# Patient Record
Sex: Male | Born: 1985 | Race: White | Hispanic: No | Marital: Married | Smoking: Never smoker
Health system: Southern US, Community
[De-identification: ages and names within clinical notes are randomized; demographics above are authoritative.]

## PROBLEM LIST (undated history)

## (undated) HISTORY — PX: NM RENAL LASIX (ARMC HX): HXRAD1213

---

## 2019-11-29 DIAGNOSIS — F4320 Adjustment disorder, unspecified: Secondary | ICD-10-CM | POA: Diagnosis not present

## 2019-12-13 DIAGNOSIS — F4320 Adjustment disorder, unspecified: Secondary | ICD-10-CM | POA: Diagnosis not present

## 2019-12-27 DIAGNOSIS — F4320 Adjustment disorder, unspecified: Secondary | ICD-10-CM | POA: Diagnosis not present

## 2020-01-10 DIAGNOSIS — F4320 Adjustment disorder, unspecified: Secondary | ICD-10-CM | POA: Diagnosis not present

## 2020-02-07 DIAGNOSIS — F4320 Adjustment disorder, unspecified: Secondary | ICD-10-CM | POA: Diagnosis not present

## 2020-07-19 DIAGNOSIS — F330 Major depressive disorder, recurrent, mild: Secondary | ICD-10-CM | POA: Diagnosis not present

## 2020-07-29 DIAGNOSIS — F330 Major depressive disorder, recurrent, mild: Secondary | ICD-10-CM | POA: Diagnosis not present

## 2020-08-05 DIAGNOSIS — F330 Major depressive disorder, recurrent, mild: Secondary | ICD-10-CM | POA: Diagnosis not present

## 2020-08-12 DIAGNOSIS — F330 Major depressive disorder, recurrent, mild: Secondary | ICD-10-CM | POA: Diagnosis not present

## 2020-08-21 ENCOUNTER — Ambulatory Visit
Admission: EM | Admit: 2020-08-21 | Discharge: 2020-08-21 | Disposition: A | Discharge status: Home / Self Care | Payer: BC Managed Care – PPO | Attending: Emergency Medicine | Admitting: Emergency Medicine

## 2020-08-21 ENCOUNTER — Other Ambulatory Visit: Payer: Self-pay

## 2020-08-21 DIAGNOSIS — J029 Acute pharyngitis, unspecified: Secondary | ICD-10-CM

## 2020-08-21 DIAGNOSIS — J069 Acute upper respiratory infection, unspecified: Secondary | ICD-10-CM | POA: Diagnosis not present

## 2020-08-21 LAB — POCT RAPID STREP A (OFFICE): Rapid Strep A Screen: NEGATIVE

## 2020-08-21 NOTE — Discharge Instructions (Addendum)
Your rapid strep test is negative.  A throat culture is pending; we will call you if it is positive requiring treatment.    Your COVID test is pending.  You should self quarantine until the test result is back.    Take Tylenol or ibuprofen as needed for fever or discomfort.  Rest and keep yourself hydrated.    Follow-up with your primary care provider if your symptoms are not improving.

## 2020-08-21 NOTE — ED Triage Notes (Cosign Needed Addendum)
Patient presents to Urgent Care with complaints of sore throat since yesterday/  Patient reports battling cold-like symptoms for the past week. He reports hx of strep throat. Denies use of medications for symptoms.

## 2020-08-21 NOTE — ED Provider Notes (Signed)
Roderic Palau    CSN: [id number] Arrival date & time: 08/21/20  1139      History   Chief Complaint Chief Complaint  Patient presents with  . Sore Throat    HPI Brett Hickman is a 34 y.o. male.   Patient presents with 1 day history of sore throat.  He reports intermittent cold symptoms for 1 week, including mild nonproductive cough and congestion.  He denies fever, rash, shortness of breath, vomiting, diarrhea, or other symptoms.  No treatments attempted at home.  He denies pertinent medical history.  The history is provided by the patient.    History reviewed. No pertinent past medical history.  There are no problems to display for this patient.   History reviewed. No pertinent surgical history.     Home Medications    Prior to Admission medications   Not on File    Family History Family History  Problem Relation Age of Onset  . Healthy Mother   . Healthy Father     Social History Social History   Tobacco Use  . Smoking status: Never Smoker  . Smokeless tobacco: Never Used  Vaping Use  . Vaping Use: Never used  Substance Use Topics  . Alcohol use: Yes    Comment: social drinker  . Drug use: Never     Allergies   Patient has no allergy information on record.   Review of Systems Review of Systems  Constitutional: Negative for chills and fever.  HENT: Positive for congestion and sore throat. Negative for ear pain.   Eyes: Negative for pain and visual disturbance.  Respiratory: Positive for cough. Negative for shortness of breath.   Cardiovascular: Negative for chest pain and palpitations.  Gastrointestinal: Negative for abdominal pain and vomiting.  Genitourinary: Negative for dysuria and hematuria.  Musculoskeletal: Negative for arthralgias and back pain.  Skin: Negative for color change and rash.  Neurological: Negative for seizures and syncope.  All other systems reviewed and are negative.    Physical Exam Triage Vital  Signs ED Triage Vitals  Enc Vitals Group     BP      Pulse      Resp      Temp      Temp src      SpO2      Weight      Height      Head Circumference      Peak Flow      Pain Score      Pain Loc      Pain Edu?      Excl. in [place]?    No data found.  Updated Vital Signs BP 114/74 (BP Location: Left Arm)   Pulse 82   Temp 98.4 F (36.9 C) (Temporal)   Resp 16   SpO2 98%   Visual Acuity Right Eye Distance:   Left Eye Distance:   Bilateral Distance:    Right Eye Near:   Left Eye Near:    Bilateral Near:     Physical Exam Vitals and nursing note reviewed.  Constitutional:      General: He is not in acute distress.    Appearance: He is well-developed.  HENT:     Head: Normocephalic and atraumatic.     Right Ear: Tympanic membrane normal.     Left Ear: Tympanic membrane normal.     Nose: Nose normal.     Mouth/Throat:     Mouth: Mucous membranes are moist.  Pharynx: Posterior oropharyngeal erythema present.  Eyes:     Conjunctiva/sclera: Conjunctivae normal.  Cardiovascular:     Rate and Rhythm: Normal rate and regular rhythm.     Heart sounds: Normal heart sounds.  Pulmonary:     Effort: Pulmonary effort is normal. No respiratory distress.     Breath sounds: Normal breath sounds. No wheezing or rhonchi.  Abdominal:     Palpations: Abdomen is soft.     Tenderness: There is no abdominal tenderness. There is no guarding or rebound.  Musculoskeletal:     Cervical back: Neck supple.  Skin:    General: Skin is warm and dry.     Findings: No rash.  Neurological:     General: No focal deficit present.     Mental Status: He is alert and oriented to person, place, and time.     Gait: Gait normal.  Psychiatric:        Mood and Affect: Mood normal.        Behavior: Behavior normal.      UC Treatments / Results  Labs (all labs ordered are listed, but only abnormal results are displayed) Labs Reviewed  CULTURE, GROUP A STREP ([facility])  NOVEL CORONAVIRUS,  NAA  POCT RAPID STREP A (OFFICE)    EKG   Radiology No results found.  Procedures Procedures (including critical care time)  Medications Ordered in UC Medications - No data to display  Initial Impression / Assessment and Plan / UC Course  I have reviewed the triage vital signs and the nursing notes.  Pertinent labs & imaging results that were available during my care of the patient were reviewed by me and considered in my medical decision making (see chart for details).   Throat, viral URI.  Rapid strep negative; culture pending.  PCR COVID pending.  Instructed patient to self quarantine.  Discussed symptomatic treatment.  Instructed him to follow-up with his PCP if his symptoms are not improving.  Patient agrees to plan of care.   Final Clinical Impressions(s) / UC Diagnoses   Final diagnoses:  Sore throat  Viral URI     Discharge Instructions     Your rapid strep test is negative.  A throat culture is pending; we will call you if it is positive requiring treatment.    Your COVID test is pending.  You should self quarantine until the test result is back.    Take Tylenol or ibuprofen as needed for fever or discomfort.  Rest and keep yourself hydrated.    Follow-up with your primary care provider if your symptoms are not improving.        ED Prescriptions    None     PDMP not reviewed this encounter.   Sharion Balloon, NP 08/21/20 1245

## 2020-08-22 LAB — SARS-COV-2, NAA 2 DAY TAT

## 2020-08-22 LAB — NOVEL CORONAVIRUS, NAA: SARS-CoV-2, NAA: NOT DETECTED

## 2020-08-24 LAB — CULTURE, GROUP A STREP (THRC)

## 2020-08-28 ENCOUNTER — Encounter: Payer: Self-pay | Admitting: Emergency Medicine

## 2020-08-28 ENCOUNTER — Ambulatory Visit
Admission: EM | Admit: 2020-08-28 | Discharge: 2020-08-28 | Disposition: A | Discharge status: Home / Self Care | Payer: BC Managed Care – PPO | Attending: Emergency Medicine | Admitting: Emergency Medicine

## 2020-08-28 ENCOUNTER — Other Ambulatory Visit: Payer: Self-pay

## 2020-08-28 DIAGNOSIS — J0100 Acute maxillary sinusitis, unspecified: Secondary | ICD-10-CM | POA: Diagnosis not present

## 2020-08-28 MED ORDER — AMOXICILLIN 875 MG PO TABS: 875.0000 mg | ORAL_TABLET | Freq: Two times a day (BID) | ORAL | 0 refills | Status: AC

## 2020-08-28 NOTE — Discharge Instructions (Addendum)
Take the amoxicillin as directed.  Follow up with your primary care provider if your symptoms are not improving.   ° ° °

## 2020-08-28 NOTE — ED Provider Notes (Signed)
Brett Hickman    CSN: [id number] Arrival date & time: 08/28/20  5784      History   Chief Complaint Chief Complaint  Patient presents with  . Headache  . Cough    HPI Brett Hickman is a 34 y.o. male.   Patient presents with 2-week history of of cough productive of green sputum, headache, nasal congestion, ears ringing, dizziness.  Treatment attempted at home with Sudafed.  He denies fever, rash, shortness of breath, vomiting, diarrhea, or other symptoms.  Patient was seen here on 08/21/2020; diagnosed with sore throat and viral URI; treated symptomatically; throat culture negative, COVID negative.  The history is provided by the patient and medical records.    History reviewed. No pertinent past medical history.  There are no problems to display for this patient.   History reviewed. No pertinent surgical history.     Home Medications    Prior to Admission medications   Medication Sig Start Date End Date Taking? Authorizing Provider  amoxicillin (AMOXIL) 875 MG tablet Take 1 tablet (875 mg total) by mouth 2 (two) times daily for 7 days. 08/28/20 09/04/20  Sharion Balloon, NP    Family History Family History  Problem Relation Age of Onset  . Healthy Mother   . Healthy Father     Social History Social History   Tobacco Use  . Smoking status: Never Smoker  . Smokeless tobacco: Never Used  Vaping Use  . Vaping Use: Never used  Substance Use Topics  . Alcohol use: Yes    Comment: social drinker  . Drug use: Never     Allergies   Patient has no known allergies.   Review of Systems Review of Systems  Constitutional: Negative for chills and fever.  HENT: Positive for congestion, ear pain and postnasal drip. Negative for sore throat.   Eyes: Negative for pain and visual disturbance.  Respiratory: Positive for cough. Negative for shortness of breath.   Cardiovascular: Negative for chest pain and palpitations.  Gastrointestinal: Negative for  abdominal pain, diarrhea and vomiting.  Genitourinary: Negative for dysuria and hematuria.  Musculoskeletal: Negative for arthralgias and back pain.  Skin: Negative for color change and rash.  Neurological: Positive for dizziness. Negative for seizures and syncope.  All other systems reviewed and are negative.    Physical Exam Triage Vital Signs ED Triage Vitals  Enc Vitals Group     BP      Pulse      Resp      Temp      Temp src      SpO2      Weight      Height      Head Circumference      Peak Flow      Pain Score      Pain Loc      Pain Edu?      Excl. in [place]?    No data found.  Updated Vital Signs BP 106/63 (BP Location: Right Arm)   Pulse 97   Temp 97.9 F (36.6 C) (Oral)   Resp 15   Ht 6\' 3"  (1.905 m)   Wt 175 lb (79.4 kg)   SpO2 95%   BMI 21.87 kg/m   Visual Acuity Right Eye Distance:   Left Eye Distance:   Bilateral Distance:    Right Eye Near:   Left Eye Near:    Bilateral Near:     Physical Exam Vitals and nursing note reviewed.  Constitutional:      General: He is not in acute distress.    Appearance: He is well-developed.  HENT:     Head: Normocephalic and atraumatic.     Right Ear: Tympanic membrane normal.     Left Ear: Tympanic membrane normal.     Nose: Congestion present.     Mouth/Throat:     Mouth: Mucous membranes are moist.     Pharynx: Oropharynx is clear.  Eyes:     Conjunctiva/sclera: Conjunctivae normal.  Cardiovascular:     Rate and Rhythm: Normal rate and regular rhythm.     Heart sounds: Normal heart sounds.  Pulmonary:     Effort: Pulmonary effort is normal. No respiratory distress.     Breath sounds: Normal breath sounds.  Abdominal:     Palpations: Abdomen is soft.     Tenderness: There is no abdominal tenderness.  Musculoskeletal:     Cervical back: Neck supple.  Skin:    General: Skin is warm and dry.     Findings: No rash.  Neurological:     General: No focal deficit present.     Mental Status: He is  alert and oriented to person, place, and time.     Gait: Gait normal.  Psychiatric:        Mood and Affect: Mood normal.        Behavior: Behavior normal.      UC Treatments / Results  Labs (all labs ordered are listed, but only abnormal results are displayed) Labs Reviewed - No data to display  EKG   Radiology No results found.  Procedures Procedures (including critical care time)  Medications Ordered in UC Medications - No data to display  Initial Impression / Assessment and Plan / UC Course  I have reviewed the triage vital signs and the nursing notes.  Pertinent labs & imaging results that were available during my care of the patient were reviewed by me and considered in my medical decision making (see chart for details).   Acute sinusitis.  Treating with amoxicillin.  Discussed other symptomatic treatment including Mucinex.  Instructed patient to follow-up with his PCP if his symptoms are not improving.  Patient agrees to plan of care.   Final Clinical Impressions(s) / UC Diagnoses   Final diagnoses:  Acute non-recurrent maxillary sinusitis     Discharge Instructions     Take the amoxicillin as directed.    Follow up with your primary care provider if your symptoms are not improving.       ED Prescriptions    Medication Sig Dispense Auth. Provider   amoxicillin (AMOXIL) 875 MG tablet Take 1 tablet (875 mg total) by mouth 2 (two) times daily for 7 days. 14 tablet Sharion Balloon, NP     PDMP not reviewed this encounter.   Sharion Balloon, NP 08/28/20 [phone]

## 2020-08-28 NOTE — ED Triage Notes (Signed)
Patient c/o productive cough , headache, nasal congestion, dizziness, and tinnitus x 2 weeks.   Patient stated he presented at this clinic a week ago and stated he received COVID-19 testing and strep testing.   Patient stated he has taken Sudafed with relief of congestion.   Patient endorses "grenish" sputum production.   Patient denies fever.

## 2020-09-04 DIAGNOSIS — U071 COVID-19: Secondary | ICD-10-CM

## 2020-09-04 HISTORY — DX: COVID-19: U07.1

## 2020-10-21 DIAGNOSIS — F330 Major depressive disorder, recurrent, mild: Secondary | ICD-10-CM | POA: Diagnosis not present

## 2020-11-04 DIAGNOSIS — F330 Major depressive disorder, recurrent, mild: Secondary | ICD-10-CM | POA: Diagnosis not present

## 2020-11-11 DIAGNOSIS — F330 Major depressive disorder, recurrent, mild: Secondary | ICD-10-CM | POA: Diagnosis not present

## 2020-11-18 DIAGNOSIS — F330 Major depressive disorder, recurrent, mild: Secondary | ICD-10-CM | POA: Diagnosis not present

## 2020-12-09 DIAGNOSIS — F330 Major depressive disorder, recurrent, mild: Secondary | ICD-10-CM | POA: Diagnosis not present

## 2020-12-16 DIAGNOSIS — F330 Major depressive disorder, recurrent, mild: Secondary | ICD-10-CM | POA: Diagnosis not present

## 2020-12-30 DIAGNOSIS — F330 Major depressive disorder, recurrent, mild: Secondary | ICD-10-CM | POA: Diagnosis not present

## 2021-01-06 DIAGNOSIS — F330 Major depressive disorder, recurrent, mild: Secondary | ICD-10-CM | POA: Diagnosis not present

## 2021-01-13 DIAGNOSIS — F330 Major depressive disorder, recurrent, mild: Secondary | ICD-10-CM | POA: Diagnosis not present

## 2021-01-14 DIAGNOSIS — H40022 Open angle with borderline findings, high risk, left eye: Secondary | ICD-10-CM | POA: Diagnosis not present

## 2021-01-20 DIAGNOSIS — F330 Major depressive disorder, recurrent, mild: Secondary | ICD-10-CM | POA: Diagnosis not present

## 2021-01-27 DIAGNOSIS — F330 Major depressive disorder, recurrent, mild: Secondary | ICD-10-CM | POA: Diagnosis not present

## 2021-02-03 DIAGNOSIS — F330 Major depressive disorder, recurrent, mild: Secondary | ICD-10-CM | POA: Diagnosis not present

## 2021-02-17 DIAGNOSIS — F330 Major depressive disorder, recurrent, mild: Secondary | ICD-10-CM | POA: Diagnosis not present

## 2021-02-24 DIAGNOSIS — F330 Major depressive disorder, recurrent, mild: Secondary | ICD-10-CM | POA: Diagnosis not present

## 2021-02-28 DIAGNOSIS — H40013 Open angle with borderline findings, low risk, bilateral: Secondary | ICD-10-CM | POA: Diagnosis not present

## 2021-03-10 DIAGNOSIS — F330 Major depressive disorder, recurrent, mild: Secondary | ICD-10-CM | POA: Diagnosis not present

## 2021-03-17 DIAGNOSIS — F330 Major depressive disorder, recurrent, mild: Secondary | ICD-10-CM | POA: Diagnosis not present

## 2021-03-25 ENCOUNTER — Encounter: Payer: Self-pay | Admitting: Emergency Medicine

## 2021-03-25 ENCOUNTER — Other Ambulatory Visit: Payer: Self-pay

## 2021-03-25 ENCOUNTER — Ambulatory Visit
Admission: EM | Admit: 2021-03-25 | Discharge: 2021-03-25 | Disposition: A | Discharge status: Home / Self Care | Payer: BC Managed Care – PPO

## 2021-03-25 DIAGNOSIS — J302 Other seasonal allergic rhinitis: Secondary | ICD-10-CM | POA: Diagnosis not present

## 2021-03-25 DIAGNOSIS — F4323 Adjustment disorder with mixed anxiety and depressed mood: Secondary | ICD-10-CM | POA: Diagnosis not present

## 2021-03-25 DIAGNOSIS — J069 Acute upper respiratory infection, unspecified: Secondary | ICD-10-CM

## 2021-03-25 NOTE — ED Provider Notes (Signed)
Brett Hickman    CSN: [id number] Arrival date & time: 03/25/21  7353      History   Chief Complaint Chief Complaint  Patient presents with   Headache   Sore Throat   Otalgia    HPI Brett Hickman is a 35 y.o. male.  Patient presents with 1 day history of sinus pressure, sinus headache, sore throat.  He denies fever, rash, cough, shortness of breath, or other symptoms.  He reports intermittent sinus symptoms x8 months.  Treatment attempted at home with daily Zyrtec.    HPI  Past Medical History:  Diagnosis Date   COVID-19 09/2020    There are no problems to display for this patient.   History reviewed. No pertinent surgical history.     Home Medications    Prior to Admission medications   Medication Sig Start Date End Date Taking? Authorizing Provider  cetirizine (ZYRTEC) 10 MG tablet Take 10 mg by mouth daily.   Yes [provider]    Family History Family History  Problem Relation Age of Onset   Healthy Mother    Healthy Father     Social History Social History   Tobacco Use   Smoking status: Never   Smokeless tobacco: Never  Vaping Use   Vaping Use: Never used  Substance Use Topics   Alcohol use: Yes    Comment: social drinker   Drug use: Never     Allergies   Patient has no known allergies.   Review of Systems Review of Systems  Constitutional:  Negative for chills and fever.  HENT:  Positive for congestion, postnasal drip, rhinorrhea, sinus pressure and sore throat. Negative for ear pain.   Respiratory:  Negative for cough and shortness of breath.   Cardiovascular:  Negative for chest pain and palpitations.  Gastrointestinal:  Negative for abdominal pain, diarrhea and vomiting.  Skin:  Negative for color change and rash.  Neurological:  Positive for headaches. Negative for dizziness and syncope.  All other systems reviewed and are negative.   Physical Exam Triage Vital Signs ED Triage Vitals  Enc Vitals Group      BP      Pulse      Resp      Temp      Temp src      SpO2      Weight      Height      Head Circumference      Peak Flow      Pain Score      Pain Loc      Pain Edu?      Excl. in [place]?    No data found.  Updated Vital Signs BP 118/72   Pulse 82   Temp 98.5 F (36.9 C) (Oral)   Resp 18   SpO2 96%   Visual Acuity Right Eye Distance:   Left Eye Distance:   Bilateral Distance:    Right Eye Near:   Left Eye Near:    Bilateral Near:     Physical Exam Vitals and nursing note reviewed.  Constitutional:      General: He is not in acute distress.    Appearance: He is well-developed. He is not ill-appearing.  HENT:     Head: Normocephalic and atraumatic.     Right Ear: Tympanic membrane normal.     Left Ear: Tympanic membrane normal.     Nose: Congestion and rhinorrhea present.     Mouth/Throat:  Mouth: Mucous membranes are moist.     Pharynx: Oropharynx is clear.  Eyes:     Conjunctiva/sclera: Conjunctivae normal.  Cardiovascular:     Rate and Rhythm: Normal rate and regular rhythm.     Heart sounds: Normal heart sounds.  Pulmonary:     Effort: Pulmonary effort is normal. No respiratory distress.     Breath sounds: Normal breath sounds.  Abdominal:     Palpations: Abdomen is soft.     Tenderness: There is no abdominal tenderness.  Musculoskeletal:     Cervical back: Neck supple.  Skin:    General: Skin is warm and dry.  Neurological:     General: No focal deficit present.     Mental Status: He is alert and oriented to person, place, and time.     Gait: Gait normal.  Psychiatric:        Mood and Affect: Mood normal.        Behavior: Behavior normal.     UC Treatments / Results  Labs (all labs ordered are listed, but only abnormal results are displayed) Labs Reviewed - No data to display  EKG   Radiology No results found.  Procedures Procedures (including critical care time)  Medications Ordered in UC Medications - No data to  display  Initial Impression / Assessment and Plan / UC Course  I have reviewed the triage vital signs and the nursing notes.  Pertinent labs & imaging results that were available during my care of the patient were reviewed by me and considered in my medical decision making (see chart for details).  Seasonal allergies, URI.  Patient has had intermittent sinus problems for the past 8 months.  His current symptoms started yesterday.  Treating today with OTC Flonase nasal spray and Mucinex D.  Education provided on seasonal allergies and upper respiratory infections.  Instructed him to follow-up with his PCP or return here if his symptoms are not improving.  He agrees to plan of care.   Final Clinical Impressions(s) / UC Diagnoses   Final diagnoses:  Seasonal allergies  Upper respiratory tract infection, unspecified type     Discharge Instructions      Use the Flonase nasal spray and take Mucinex D as discussed.    Follow up with your primary care provider if your symptoms are not improving.         ED Prescriptions   None    PDMP not reviewed this encounter.   Sharion Balloon, NP 03/25/21 [phone]

## 2021-03-25 NOTE — ED Triage Notes (Addendum)
Pt presents today with c/o of left sided sinus pressure, headache and sore throat that began yesterday. No meds pta.  Denies fever. He has had these symptoms since November, he believes is a sinus infection.

## 2021-03-25 NOTE — Discharge Instructions (Addendum)
Use the Flonase nasal spray and take Mucinex D as discussed.    Follow up with your primary care provider if your symptoms are not improving.

## 2021-03-31 DIAGNOSIS — F330 Major depressive disorder, recurrent, mild: Secondary | ICD-10-CM | POA: Diagnosis not present

## 2021-04-14 DIAGNOSIS — F330 Major depressive disorder, recurrent, mild: Secondary | ICD-10-CM | POA: Diagnosis not present

## 2021-04-21 DIAGNOSIS — F330 Major depressive disorder, recurrent, mild: Secondary | ICD-10-CM | POA: Diagnosis not present

## 2021-04-28 DIAGNOSIS — F330 Major depressive disorder, recurrent, mild: Secondary | ICD-10-CM | POA: Diagnosis not present

## 2021-05-05 DIAGNOSIS — F330 Major depressive disorder, recurrent, mild: Secondary | ICD-10-CM | POA: Diagnosis not present

## 2021-05-12 DIAGNOSIS — F330 Major depressive disorder, recurrent, mild: Secondary | ICD-10-CM | POA: Diagnosis not present

## 2021-05-19 DIAGNOSIS — F330 Major depressive disorder, recurrent, mild: Secondary | ICD-10-CM | POA: Diagnosis not present

## 2021-05-26 DIAGNOSIS — F330 Major depressive disorder, recurrent, mild: Secondary | ICD-10-CM | POA: Diagnosis not present

## 2021-06-02 DIAGNOSIS — F330 Major depressive disorder, recurrent, mild: Secondary | ICD-10-CM | POA: Diagnosis not present

## 2021-06-16 DIAGNOSIS — F330 Major depressive disorder, recurrent, mild: Secondary | ICD-10-CM | POA: Diagnosis not present

## 2021-06-30 DIAGNOSIS — F330 Major depressive disorder, recurrent, mild: Secondary | ICD-10-CM | POA: Diagnosis not present

## 2021-07-07 DIAGNOSIS — F330 Major depressive disorder, recurrent, mild: Secondary | ICD-10-CM | POA: Diagnosis not present

## 2021-07-10 ENCOUNTER — Ambulatory Visit (INDEPENDENT_AMBULATORY_CARE_PROVIDER_SITE_OTHER): Payer: BC Managed Care – PPO | Admitting: Family Medicine

## 2021-07-10 ENCOUNTER — Other Ambulatory Visit: Payer: Self-pay

## 2021-07-10 ENCOUNTER — Encounter: Payer: Self-pay | Admitting: Family Medicine

## 2021-07-10 VITALS — BP 104/60 | HR 65 | Temp 97.0°F | Ht 74.25 in | Wt 170.2 lb

## 2021-07-10 DIAGNOSIS — J329 Chronic sinusitis, unspecified: Secondary | ICD-10-CM | POA: Diagnosis not present

## 2021-07-10 DIAGNOSIS — Z13220 Encounter for screening for lipoid disorders: Secondary | ICD-10-CM | POA: Diagnosis not present

## 2021-07-10 DIAGNOSIS — Z114 Encounter for screening for human immunodeficiency virus [HIV]: Secondary | ICD-10-CM

## 2021-07-10 DIAGNOSIS — R5383 Other fatigue: Secondary | ICD-10-CM | POA: Insufficient documentation

## 2021-07-10 DIAGNOSIS — L989 Disorder of the skin and subcutaneous tissue, unspecified: Secondary | ICD-10-CM

## 2021-07-10 DIAGNOSIS — Z1159 Encounter for screening for other viral diseases: Secondary | ICD-10-CM

## 2021-07-10 DIAGNOSIS — Z23 Encounter for immunization: Secondary | ICD-10-CM

## 2021-07-10 DIAGNOSIS — J351 Hypertrophy of tonsils: Secondary | ICD-10-CM | POA: Insufficient documentation

## 2021-07-10 LAB — CBC WITH DIFFERENTIAL/PLATELET
Basophils Absolute: 0 10*3/uL (ref 0.0–0.1)
Basophils Relative: 0.4 % (ref 0.0–3.0)
Eosinophils Absolute: 0 10*3/uL (ref 0.0–0.7)
Eosinophils Relative: 1.1 % (ref 0.0–5.0)
HCT: 38.5 % — ABNORMAL LOW (ref 39.0–52.0)
Hemoglobin: 13.5 g/dL (ref 13.0–17.0)
Lymphocytes Relative: 46.8 % — ABNORMAL HIGH (ref 12.0–46.0)
Lymphs Abs: 1.6 10*3/uL (ref 0.7–4.0)
MCHC: 35.1 g/dL (ref 30.0–36.0)
MCV: 80.7 fl (ref 78.0–100.0)
Monocytes Absolute: 0.3 10*3/uL (ref 0.1–1.0)
Monocytes Relative: 9.4 % (ref 3.0–12.0)
Neutro Abs: 1.5 10*3/uL (ref 1.4–7.7)
Neutrophils Relative %: 42.3 % — ABNORMAL LOW (ref 43.0–77.0)
Platelets: 251 10*3/uL (ref 150.0–400.0)
RBC: 4.77 Mil/uL (ref 4.22–5.81)
RDW: 13.4 % (ref 11.5–15.5)
WBC: 3.4 10*3/uL — ABNORMAL LOW (ref 4.0–10.5)

## 2021-07-10 LAB — COMPREHENSIVE METABOLIC PANEL
ALT: 19 U/L (ref 0–53)
AST: 16 U/L (ref 0–37)
Albumin: 4.7 g/dL (ref 3.5–5.2)
Alkaline Phosphatase: 75 U/L (ref 39–117)
BUN: 16 mg/dL (ref 6–23)
CO2: 27 mEq/L (ref 19–32)
Calcium: 9.5 mg/dL (ref 8.4–10.5)
Chloride: 105 mEq/L (ref 96–112)
Creatinine, Ser: 0.92 mg/dL (ref 0.40–1.50)
GFR: 106.46 mL/min (ref >60.00)
Glucose, Bld: 91 mg/dL (ref 70–99)
Potassium: 4.2 mEq/L (ref 3.5–5.1)
Sodium: 139 mEq/L (ref 135–145)
Total Bilirubin: 0.4 mg/dL (ref 0.2–1.2)
Total Protein: 7.2 g/dL (ref 6.0–8.3)

## 2021-07-10 LAB — LIPID PANEL
Cholesterol: 172 mg/dL (ref 0–200)
HDL: 36 mg/dL — ABNORMAL LOW (ref >39.00)
LDL Cholesterol: 102 mg/dL — ABNORMAL HIGH (ref 0–99)
NonHDL: 136.28
Total CHOL/HDL Ratio: 5
Triglycerides: 173 mg/dL — ABNORMAL HIGH (ref 0.0–149.0)
VLDL: 34.6 mg/dL (ref 0.0–40.0)

## 2021-07-10 LAB — TSH: TSH: 2.25 u[IU]/mL (ref 0.35–5.50)

## 2021-07-10 NOTE — Assessment & Plan Note (Signed)
No hx of recurrent strep. Does snore. Discussed if energy/fatigue not improving with exercise may consider ENT referral in the future

## 2021-07-10 NOTE — Assessment & Plan Note (Signed)
Pt concerned about more viral illnesses this year with more fatigue than typical. Discussed it is likely normal exposure and recovery within 1-2 weeks is normal. Extra fatigue could be 2/2 to recent covid infection or just generally not being the same exercise state of health as previous. Will get labs to evaluate for systemic signs, but advised watch and wait and work on adding cardio exercise.

## 2021-07-10 NOTE — Patient Instructions (Signed)
Try Neti pot with flonase as needed  Work on getting back into regular exercise -- if still limited or fatigued/tired without progression then update -- may want to consider sleep apnea evaluation   Wash hands to prevent colds as much possible

## 2021-07-10 NOTE — Progress Notes (Signed)
Subjective:     Brett Hickman is a 35 y.o. male presenting for Establish Care (Would like to discuss sinus issues )     HPI  #Skin concerns - dryness on the chin - wearing a beard and will shave occasionally to help it recover - just started using a anti-dandroff shampoo and not sure if helping - small red lesion that showed up a few years ago  #sinus issues - Started November 2021 - got a cold and had to fly to [facility] for work - cold worsened and the felt nauseous - flying back - had sharp sinus pain - cold that seemed to last for a long time, eventually started abx and symptoms improved - also had a stomach bug at home - Covid-19 in Jan 2022 - sick for 1 week and recovered - then had a few more colds which will take 2 weeks to get over it - was taking zyrtec daily but stopped this   Daughters in preschool and will bring back illness  - the November illness - does not recall being more fatigued or tired   Review of Systems   Social History   Tobacco Use  Smoking Status Never  Smokeless Tobacco Never        Objective:    BP Readings from Last 3 Encounters:  07/10/21 104/60  03/25/21 118/72  08/28/20 106/63   Wt Readings from Last 3 Encounters:  07/10/21 170 lb 4 oz (77.2 kg)  08/28/20 175 lb (79.4 kg)    BP 104/60   Pulse 65   Temp (!) 97 F (36.1 C) (Temporal)   Ht 6' 2.25" (1.886 m)   Wt 170 lb 4 oz (77.2 kg)   SpO2 98%   BMI 21.71 kg/m    Physical Exam Constitutional:      General: He is not in acute distress.    Appearance: He is well-developed. He is not ill-appearing.  HENT:     Head: Normocephalic and atraumatic.     Right Ear: Tympanic membrane and ear canal normal.     Left Ear: Tympanic membrane and ear canal normal.     Nose: Mucosal edema and rhinorrhea present.     Right Sinus: No maxillary sinus tenderness or frontal sinus tenderness.     Left Sinus: No maxillary sinus tenderness or frontal sinus tenderness.      Mouth/Throat:     Pharynx: Uvula midline. No oropharyngeal exudate or posterior oropharyngeal erythema.     Tonsils: 2+ on the right. 2+ on the left.  Cardiovascular:     Rate and Rhythm: Normal rate and regular rhythm.     Heart sounds: No murmur heard. Pulmonary:     Effort: Pulmonary effort is normal. No respiratory distress.     Breath sounds: Normal breath sounds.  Musculoskeletal:     Cervical back: Neck supple.  Lymphadenopathy:     Cervical: No cervical adenopathy.  Skin:    General: Skin is warm and dry.     Capillary Refill: Capillary refill takes less than 2 seconds.  Neurological:     Mental Status: He is alert.          Assessment & Plan:   Problem List Items Addressed This Visit       Respiratory   Recurrent sinusitis    Not currently active. Reassurance. Advised neti pot and flonase for symptom control and prevention      Relevant Medications   fluticasone (FLONASE) 50 MCG/ACT nasal spray  Other Relevant Orders   Comprehensive metabolic panel   CBC with Differential   TSH     Other   Other fatigue - Primary    Pt concerned about more viral illnesses this year with more fatigue than typical. Discussed it is likely normal exposure and recovery within 1-2 weeks is normal. Extra fatigue could be 2/2 to recent covid infection or just generally not being the same exercise state of health as previous. Will get labs to evaluate for systemic signs, but advised watch and wait and work on adding cardio exercise.       Relevant Orders   Comprehensive metabolic panel   CBC with Differential   TSH   Enlarged tonsils    No hx of recurrent strep. Does snore. Discussed if energy/fatigue not improving with exercise may consider ENT referral in the future      Other Visit Diagnoses     Need for influenza vaccination       Relevant Orders   Flu Vaccine QUAD 19mo+IM (Fluarix, Fluzone & Alfiuria Quad PF) (Completed)   Screening for hyperlipidemia       Relevant  Orders   Lipid panel   Skin lesion       Relevant Orders   Ambulatory referral to Dermatology   Need for hepatitis C screening test       Relevant Orders   Hepatitis C antibody   Encounter for screening for HIV       Relevant Orders   HIV Antibody (routine testing w rflx)        Return if symptoms worsen or fail to improve.  Lesleigh Noe, MD  This visit occurred during the SARS-CoV-2 public health emergency.  Safety protocols were in place, including screening questions prior to the visit, additional usage of staff PPE, and extensive cleaning of exam room while observing appropriate contact time as indicated for disinfecting solutions.

## 2021-07-10 NOTE — Assessment & Plan Note (Signed)
Not currently active. Reassurance. Advised neti pot and flonase for symptom control and prevention

## 2021-07-14 DIAGNOSIS — F330 Major depressive disorder, recurrent, mild: Secondary | ICD-10-CM | POA: Diagnosis not present

## 2021-07-15 LAB — HCV RNA,QUANTITATIVE REAL TIME PCR
HCV Quantitative Log: <1.18 Log IU/mL
HCV RNA, PCR, QN: <15 IU/mL

## 2021-07-15 LAB — HIV ANTIBODY (ROUTINE TESTING W REFLEX): HIV 1&2 Ab, 4th Generation: NONREACTIVE

## 2021-07-15 LAB — HEPATITIS C ANTIBODY
Hepatitis C Ab: REACTIVE — AB
SIGNAL TO CUT-OFF: 1.1 — ABNORMAL HIGH (ref <1.00)

## 2021-07-28 DIAGNOSIS — F330 Major depressive disorder, recurrent, mild: Secondary | ICD-10-CM | POA: Diagnosis not present

## 2021-08-04 DIAGNOSIS — F330 Major depressive disorder, recurrent, mild: Secondary | ICD-10-CM | POA: Diagnosis not present

## 2021-08-11 DIAGNOSIS — F330 Major depressive disorder, recurrent, mild: Secondary | ICD-10-CM | POA: Diagnosis not present

## 2021-08-18 DIAGNOSIS — F330 Major depressive disorder, recurrent, mild: Secondary | ICD-10-CM | POA: Diagnosis not present

## 2021-08-25 DIAGNOSIS — F330 Major depressive disorder, recurrent, mild: Secondary | ICD-10-CM | POA: Diagnosis not present

## 2021-09-01 DIAGNOSIS — F330 Major depressive disorder, recurrent, mild: Secondary | ICD-10-CM | POA: Diagnosis not present

## 2021-09-08 DIAGNOSIS — F330 Major depressive disorder, recurrent, mild: Secondary | ICD-10-CM | POA: Diagnosis not present

## 2021-09-15 DIAGNOSIS — F330 Major depressive disorder, recurrent, mild: Secondary | ICD-10-CM | POA: Diagnosis not present

## 2021-10-15 DIAGNOSIS — F330 Major depressive disorder, recurrent, mild: Secondary | ICD-10-CM | POA: Diagnosis not present

## 2021-10-29 DIAGNOSIS — F330 Major depressive disorder, recurrent, mild: Secondary | ICD-10-CM | POA: Diagnosis not present

## 2021-11-05 DIAGNOSIS — F330 Major depressive disorder, recurrent, mild: Secondary | ICD-10-CM | POA: Diagnosis not present

## 2021-11-12 DIAGNOSIS — F330 Major depressive disorder, recurrent, mild: Secondary | ICD-10-CM | POA: Diagnosis not present

## 2021-11-26 DIAGNOSIS — F330 Major depressive disorder, recurrent, mild: Secondary | ICD-10-CM | POA: Diagnosis not present

## 2021-12-03 DIAGNOSIS — F330 Major depressive disorder, recurrent, mild: Secondary | ICD-10-CM | POA: Diagnosis not present

## 2021-12-10 DIAGNOSIS — F330 Major depressive disorder, recurrent, mild: Secondary | ICD-10-CM | POA: Diagnosis not present

## 2021-12-26 DIAGNOSIS — T797XXA Traumatic subcutaneous emphysema, initial encounter: Secondary | ICD-10-CM | POA: Diagnosis not present

## 2021-12-26 DIAGNOSIS — D62 Acute posthemorrhagic anemia: Secondary | ICD-10-CM | POA: Diagnosis not present

## 2021-12-26 DIAGNOSIS — S27339A Laceration of lung, unspecified, initial encounter: Secondary | ICD-10-CM | POA: Diagnosis not present

## 2021-12-26 DIAGNOSIS — G8911 Acute pain due to trauma: Secondary | ICD-10-CM | POA: Diagnosis not present

## 2021-12-26 DIAGNOSIS — S2242XA Multiple fractures of ribs, left side, initial encounter for closed fracture: Secondary | ICD-10-CM | POA: Diagnosis not present

## 2021-12-26 DIAGNOSIS — S272XXA Traumatic hemopneumothorax, initial encounter: Secondary | ICD-10-CM | POA: Diagnosis not present

## 2021-12-26 DIAGNOSIS — S299XXA Unspecified injury of thorax, initial encounter: Secondary | ICD-10-CM | POA: Diagnosis not present

## 2021-12-26 DIAGNOSIS — D649 Anemia, unspecified: Secondary | ICD-10-CM | POA: Diagnosis not present

## 2021-12-26 DIAGNOSIS — S2231XA Fracture of one rib, right side, initial encounter for closed fracture: Secondary | ICD-10-CM | POA: Diagnosis not present

## 2021-12-26 DIAGNOSIS — S2241XD Multiple fractures of ribs, right side, subsequent encounter for fracture with routine healing: Secondary | ICD-10-CM | POA: Diagnosis not present

## 2021-12-26 DIAGNOSIS — J939 Pneumothorax, unspecified: Secondary | ICD-10-CM | POA: Diagnosis not present

## 2021-12-26 DIAGNOSIS — R0789 Other chest pain: Secondary | ICD-10-CM | POA: Diagnosis not present

## 2021-12-26 DIAGNOSIS — S2241XA Multiple fractures of ribs, right side, initial encounter for closed fracture: Secondary | ICD-10-CM | POA: Diagnosis not present

## 2021-12-26 DIAGNOSIS — S27331A Laceration of lung, unilateral, initial encounter: Secondary | ICD-10-CM | POA: Diagnosis not present

## 2021-12-26 DIAGNOSIS — M542 Cervicalgia: Secondary | ICD-10-CM | POA: Diagnosis not present

## 2021-12-26 DIAGNOSIS — S270XXA Traumatic pneumothorax, initial encounter: Secondary | ICD-10-CM | POA: Diagnosis not present

## 2021-12-26 DIAGNOSIS — J439 Emphysema, unspecified: Secondary | ICD-10-CM | POA: Diagnosis not present

## 2021-12-26 DIAGNOSIS — Z4682 Encounter for fitting and adjustment of non-vascular catheter: Secondary | ICD-10-CM | POA: Diagnosis not present

## 2021-12-26 DIAGNOSIS — Y9323 Activity, snow (alpine) (downhill) skiing, snow boarding, sledding, tobogganing and snow tubing: Secondary | ICD-10-CM | POA: Diagnosis not present

## 2021-12-28 DIAGNOSIS — J942 Hemothorax: Secondary | ICD-10-CM | POA: Diagnosis not present

## 2021-12-28 DIAGNOSIS — Z743 Need for continuous supervision: Secondary | ICD-10-CM | POA: Diagnosis not present

## 2021-12-28 DIAGNOSIS — S299XXA Unspecified injury of thorax, initial encounter: Secondary | ICD-10-CM | POA: Diagnosis not present

## 2021-12-28 DIAGNOSIS — T797XXA Traumatic subcutaneous emphysema, initial encounter: Secondary | ICD-10-CM | POA: Diagnosis not present

## 2021-12-28 DIAGNOSIS — D62 Acute posthemorrhagic anemia: Secondary | ICD-10-CM | POA: Diagnosis not present

## 2021-12-28 DIAGNOSIS — S2249XA Multiple fractures of ribs, unspecified side, initial encounter for closed fracture: Secondary | ICD-10-CM | POA: Diagnosis not present

## 2021-12-28 DIAGNOSIS — J439 Emphysema, unspecified: Secondary | ICD-10-CM | POA: Diagnosis not present

## 2021-12-28 DIAGNOSIS — S272XXA Traumatic hemopneumothorax, initial encounter: Secondary | ICD-10-CM | POA: Diagnosis not present

## 2021-12-28 DIAGNOSIS — R071 Chest pain on breathing: Secondary | ICD-10-CM | POA: Diagnosis not present

## 2021-12-28 DIAGNOSIS — K5900 Constipation, unspecified: Secondary | ICD-10-CM | POA: Diagnosis not present

## 2021-12-28 DIAGNOSIS — G8918 Other acute postprocedural pain: Secondary | ICD-10-CM | POA: Diagnosis not present

## 2021-12-28 DIAGNOSIS — S27339A Laceration of lung, unspecified, initial encounter: Secondary | ICD-10-CM | POA: Diagnosis not present

## 2021-12-28 DIAGNOSIS — S2241XA Multiple fractures of ribs, right side, initial encounter for closed fracture: Secondary | ICD-10-CM | POA: Diagnosis not present

## 2021-12-28 DIAGNOSIS — W228XXA Striking against or struck by other objects, initial encounter: Secondary | ICD-10-CM | POA: Diagnosis not present

## 2021-12-28 DIAGNOSIS — J939 Pneumothorax, unspecified: Secondary | ICD-10-CM | POA: Diagnosis not present

## 2021-12-28 DIAGNOSIS — R339 Retention of urine, unspecified: Secondary | ICD-10-CM | POA: Diagnosis not present

## 2021-12-28 DIAGNOSIS — Z4682 Encounter for fitting and adjustment of non-vascular catheter: Secondary | ICD-10-CM | POA: Diagnosis not present

## 2021-12-28 DIAGNOSIS — S2690XA Unspecified injury of heart, unspecified with or without hemopericardium, initial encounter: Secondary | ICD-10-CM | POA: Diagnosis not present

## 2021-12-28 DIAGNOSIS — Y9323 Activity, snow (alpine) (downhill) skiing, snow boarding, sledding, tobogganing and snow tubing: Secondary | ICD-10-CM | POA: Diagnosis not present

## 2021-12-28 DIAGNOSIS — J9811 Atelectasis: Secondary | ICD-10-CM | POA: Diagnosis not present

## 2021-12-28 DIAGNOSIS — G8911 Acute pain due to trauma: Secondary | ICD-10-CM | POA: Diagnosis not present

## 2021-12-29 HISTORY — PX: OTHER SURGICAL HISTORY: SHX169

## 2021-12-30 DIAGNOSIS — S2241XA Multiple fractures of ribs, right side, initial encounter for closed fracture: Secondary | ICD-10-CM | POA: Diagnosis not present

## 2021-12-30 DIAGNOSIS — G8911 Acute pain due to trauma: Secondary | ICD-10-CM | POA: Diagnosis not present

## 2022-01-06 ENCOUNTER — Encounter: Payer: Self-pay | Admitting: Family Medicine

## 2022-01-06 DIAGNOSIS — S2249XD Multiple fractures of ribs, unspecified side, subsequent encounter for fracture with routine healing: Secondary | ICD-10-CM

## 2022-01-06 MED ORDER — OXYCODONE HCL 5 MG PO TABS: 5.0000 mg | ORAL_TABLET | ORAL | 0 refills | Status: DC | PRN

## 2022-01-07 ENCOUNTER — Ambulatory Visit (INDEPENDENT_AMBULATORY_CARE_PROVIDER_SITE_OTHER): Payer: BC Managed Care – PPO | Admitting: Family Medicine

## 2022-01-07 ENCOUNTER — Encounter: Payer: Self-pay | Admitting: Family Medicine

## 2022-01-07 ENCOUNTER — Ambulatory Visit (INDEPENDENT_AMBULATORY_CARE_PROVIDER_SITE_OTHER)
Admission: RE | Admit: 2022-01-07 | Discharge: 2022-01-07 | Disposition: A | Discharge status: Home / Self Care | Payer: BC Managed Care – PPO | Source: Ambulatory Visit | Attending: Family Medicine | Admitting: Family Medicine

## 2022-01-07 VITALS — BP 100/60 | HR 73 | Temp 98.1°F | Ht 75.0 in | Wt 173.4 lb

## 2022-01-07 DIAGNOSIS — S2249XD Multiple fractures of ribs, unspecified side, subsequent encounter for fracture with routine healing: Secondary | ICD-10-CM | POA: Insufficient documentation

## 2022-01-07 DIAGNOSIS — J984 Other disorders of lung: Secondary | ICD-10-CM | POA: Diagnosis not present

## 2022-01-07 DIAGNOSIS — S2241XD Multiple fractures of ribs, right side, subsequent encounter for fracture with routine healing: Secondary | ICD-10-CM | POA: Diagnosis not present

## 2022-01-07 DIAGNOSIS — S2241XA Multiple fractures of ribs, right side, initial encounter for closed fracture: Secondary | ICD-10-CM | POA: Insufficient documentation

## 2022-01-07 DIAGNOSIS — J942 Hemothorax: Secondary | ICD-10-CM | POA: Insufficient documentation

## 2022-01-07 DIAGNOSIS — R918 Other nonspecific abnormal finding of lung field: Secondary | ICD-10-CM | POA: Diagnosis not present

## 2022-01-07 HISTORY — DX: Multiple fractures of ribs, unspecified side, subsequent encounter for fracture with routine healing: S22.49XD

## 2022-01-07 MED ORDER — OXYCODONE HCL 5 MG PO TABS: 5.0000 mg | ORAL_TABLET | ORAL | 0 refills | Status: DC | PRN

## 2022-01-07 NOTE — Assessment & Plan Note (Signed)
Chest tube sites are healing and surgical sites healing. Discussed warning signs for recurrence and ER precautions. CXR w/o obvious pneumothorax though comparisons not available.  ?

## 2022-01-07 NOTE — Assessment & Plan Note (Signed)
Still with persistent pain, worse with laying flat. Cont incentive spirometry. Currently using #8 of the Oxycodone 5 mg daily - discussed decreasing as tolerated. #60 sent to pharmacy. Advised f/u in 2 weeks to check in if having difficulty tapering.  ?
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
## 2022-01-08 ENCOUNTER — Telehealth: Payer: Self-pay

## 2022-01-08 NOTE — Telephone Encounter (Signed)
[facility] Night - Client ?Nonclinical Telephone Record  ?[facility]? ?Client [name] - Client ?Client Site [facility] ?Provider Waunita Schooner- MD ?Contact Type Call ?Who Is Calling Patient / Member / Family / Caregiver ?Caller Name Jarian Longoria ?Caller Phone Number [phone] ?Patient Name Brett Hickman ?Patient DOB Jul 24, 1986 ?Call Type Message Only Information Provided ?Reason for Call Request for General Office Information ?Initial Comment Caller states he needs a prescription filled from today's office visit. He is unable to get it filled ?due to insurance having an issue with the dosage ?Disp. Time Disposition Final User ?01/07/2022 7:46:56 PM General Information Provided Yes Zane Herald ?Call Closed By: Zane Herald ?Transaction Date/Time: 01/07/2022 7:44:03 PM (ET ?

## 2022-01-08 NOTE — Telephone Encounter (Signed)
Noted, it was likely too early as I had sent in #10 pills the day before.

## 2022-01-08 NOTE — Telephone Encounter (Signed)
I spoke with pt and pt said Dr Einar Pheasant knew that pt was taking 2 oxycodone 5 mg during the night q 4 h due to pain level and resting. When pt went to pick up med pharmacy said ins would not pay because it was too soon to pick up med.pt said the call after hours nurse call was an error because pt was trying to get [facility] nurse to see if she could intervene so ins would cover cost of med. Pt did pick up oxycodone 5 mg but paid out of pocket.pt said since our office received the note to let Dr Einar Pheasant know what a hard time pt had getting his pain med but nothing further to be done at this time. Sending note to DR Einar Pheasant. ?

## 2022-01-12 ENCOUNTER — Ambulatory Visit (INDEPENDENT_AMBULATORY_CARE_PROVIDER_SITE_OTHER): Payer: BC Managed Care – PPO | Admitting: Dermatology

## 2022-01-12 DIAGNOSIS — D229 Melanocytic nevi, unspecified: Secondary | ICD-10-CM

## 2022-01-12 DIAGNOSIS — L814 Other melanin hyperpigmentation: Secondary | ICD-10-CM

## 2022-01-12 DIAGNOSIS — L219 Seborrheic dermatitis, unspecified: Secondary | ICD-10-CM | POA: Diagnosis not present

## 2022-01-12 DIAGNOSIS — D2239 Melanocytic nevi of other parts of face: Secondary | ICD-10-CM | POA: Diagnosis not present

## 2022-01-12 DIAGNOSIS — D485 Neoplasm of uncertain behavior of skin: Secondary | ICD-10-CM | POA: Diagnosis not present

## 2022-01-12 DIAGNOSIS — D1800 Hemangioma unspecified site: Secondary | ICD-10-CM

## 2022-01-12 DIAGNOSIS — L821 Other seborrheic keratosis: Secondary | ICD-10-CM

## 2022-01-12 DIAGNOSIS — L578 Other skin changes due to chronic exposure to nonionizing radiation: Secondary | ICD-10-CM

## 2022-01-12 DIAGNOSIS — Z1283 Encounter for screening for malignant neoplasm of skin: Secondary | ICD-10-CM

## 2022-01-12 MED ORDER — KETOCONAZOLE 2 % EX SHAM: MEDICATED_SHAMPOO | CUTANEOUS | 6 refills | Status: AC

## 2022-01-12 NOTE — Patient Instructions (Signed)

## 2022-01-12 NOTE — Progress Notes (Signed)
? ?New Patient Visit ? ?Subjective  ?Brett Hickman is a 36 y.o. male who presents for the following: Annual Exam (No personal hx of skin cancer or dysplastic nevi.). The patient presents for Total-Body Skin Exam (TBSE) for skin cancer screening and mole check.  The patient has spots, moles and lesions to be evaluated, some may be new or changing and the patient has concerns that these could be cancer. ? ?The following portions of the chart were reviewed this encounter and updated as appropriate:  ? Tobacco  Allergies  Meds  Problems  Med Hx  Surg Hx  Fam Hx   ?  ?Review of Systems:  No other skin or systemic complaints except as noted in HPI or Assessment and Plan. ? ?Objective  ?Well appearing patient in no apparent distress; mood and affect are within normal limits. ? ?A full examination was performed including scalp, head, eyes, ears, nose, lips, neck, chest, axillae, abdomen, back, buttocks, bilateral upper extremities, bilateral lower extremities, hands, feet, fingers, toes, fingernails, and toenails. All findings within normal limits unless otherwise noted below. ? ?L low back, sacral area, L buttocks ?Irregular appearing nevi.  ? ?Nose ?Fibrous papule. ? ? ?Assessment & Plan  ?Seborrheic dermatitis ?Scalp and chin ?Seborrheic Dermatitis  ?-  is a chronic persistent rash characterized by pinkness and scaling most commonly of the mid face but also can occur on the scalp (dandruff), ears; mid chest, mid back and groin.  It tends to be exacerbated by stress and cooler weather.  People who have neurologic disease may experience new onset or exacerbation of existing seborrheic dermatitis.  The condition is not curable but treatable and can be controlled. ? ?Start Ketoconazole 2% shampoo - shampoo into the face and scalp let sit 5-10 minutes then wash out. Use 3 days per week.  ? ?ketoconazole (NIZORAL) 2 % shampoo - Scalp and chin ?Shampoo into the face and scalp let sit 5-10 minutes then wash out. Use 3  days per week. ? ?Neoplasm of uncertain behavior of skin ?L low back, sacral area, L buttocks ?Plan biopsies once patient has healed from broken ribs the L low back and sacral area will be irregular nevi r/o dysplasia, and the L buttocks will be irritated nevus r/o dysplasia. ? ?Fibrous papule of nose ?Nose ?Benign-appearing.  Observation.  Call clinic for new or changing lesions.  Recommend daily use of broad spectrum spf 30+ sunscreen to sun-exposed areas.  ? ?Skin cancer screening ? ?Lentigines ?- Scattered tan macules ?- Due to sun exposure ?- Benign-appearing, observe ?- Recommend daily broad spectrum sunscreen SPF 30+ to sun-exposed areas, reapply every 2 hours as needed. ?- Call for any changes ? ?Seborrheic Keratoses ?- Stuck-on, waxy, tan-brown papules and/or plaques  ?- Benign-appearing ?- Discussed benign etiology and prognosis. ?- Observe ?- Call for any changes ? ?Melanocytic Nevi ?- Tan-brown and/or pink-flesh-colored symmetric macules and papules ?- Benign appearing on exam today ?- Observation ?- Call clinic for new or changing moles ?- Recommend daily use of broad spectrum spf 30+ sunscreen to sun-exposed areas.  ? ?Hemangiomas ?- Red papules ?- Discussed benign nature ?- Observe ?- Call for any changes ? ?Actinic Damage ?- Chronic condition, secondary to cumulative UV/sun exposure ?- diffuse scaly erythematous macules with underlying dyspigmentation ?- Recommend daily broad spectrum sunscreen SPF 30+ to sun-exposed areas, reapply every 2 hours as needed.  ?- Staying in the shade or wearing long sleeves, sun glasses (UVA+UVB protection) and wide brim hats (4-inch brim around the  entire circumference of the hat) are also recommended for sun protection.  ?- Call for new or changing lesions. ? ?Skin cancer screening performed today. ? ?Return in about 2 months (around 03/14/2022) for shave removal/biopsies. ? ?I, Rudell Cobb, CMA, am acting as scribe for Sarina Ser, MD . ?Documentation: I have  reviewed the above documentation for accuracy and completeness, and I agree with the above. ? ?Sarina Ser, MD ? ? ?

## 2022-01-19 ENCOUNTER — Encounter: Payer: Self-pay | Admitting: Dermatology

## 2022-01-26 ENCOUNTER — Ambulatory Visit (INDEPENDENT_AMBULATORY_CARE_PROVIDER_SITE_OTHER): Payer: BC Managed Care – PPO | Admitting: Family Medicine

## 2022-01-26 VITALS — BP 92/60 | HR 72 | Temp 98.1°F | Ht 75.0 in | Wt 173.0 lb

## 2022-01-26 DIAGNOSIS — J329 Chronic sinusitis, unspecified: Secondary | ICD-10-CM | POA: Diagnosis not present

## 2022-01-26 DIAGNOSIS — R200 Anesthesia of skin: Secondary | ICD-10-CM | POA: Insufficient documentation

## 2022-01-26 DIAGNOSIS — S2241XD Multiple fractures of ribs, right side, subsequent encounter for fracture with routine healing: Secondary | ICD-10-CM

## 2022-01-26 DIAGNOSIS — J351 Hypertrophy of tonsils: Secondary | ICD-10-CM | POA: Diagnosis not present

## 2022-01-26 DIAGNOSIS — R202 Paresthesia of skin: Secondary | ICD-10-CM

## 2022-01-26 DIAGNOSIS — J942 Hemothorax: Secondary | ICD-10-CM

## 2022-01-26 DIAGNOSIS — S2249XD Multiple fractures of ribs, unspecified side, subsequent encounter for fracture with routine healing: Secondary | ICD-10-CM

## 2022-01-26 MED ORDER — OXYCODONE HCL 5 MG PO TABS: 5.0000 mg | ORAL_TABLET | ORAL | 0 refills | Status: DC | PRN

## 2022-01-26 NOTE — Assessment & Plan Note (Signed)
Patient with numbness since his hospitalization along the right abdomen.  He did have an epidural in the hospital and notes it feels like his epidural has not worn off.  Given he is several weeks out discussed referral to neurology for further evaluation.  Update if new or worsening numbness. ?

## 2022-01-26 NOTE — Assessment & Plan Note (Signed)
He is doing well with pain control primarily using ibuprofen at this time with rare use of oxycodone.  Refill of oxycodone provided if he notes some worsening pain as he starts to return to normal functioning.  He also notes numbness see plan ?
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
## 2022-01-26 NOTE — Assessment & Plan Note (Signed)
Using breath strips at night w/ improvement. No infection today but would like ENT evaluation.  ?

## 2022-01-26 NOTE — Assessment & Plan Note (Signed)
Using breath right strips w/ some improvement. Requests ENT evaluation ?

## 2022-01-26 NOTE — Patient Instructions (Addendum)
Numbness ?- neurology referral ?- call if getting worse ? ?#Referral ?I have placed a referral to a specialist for you. You should receive a phone call from the specialty office. Make sure your voicemail is not full and that if you are able to answer your phone to unknown or new numbers.  ? ?It may take up to 2 weeks to hear about the referral. If you do not hear anything in 2 weeks, please call our office and ask to speak with the referral coordinator.  ? ? ?Drink some water ? ?

## 2022-01-26 NOTE — Assessment & Plan Note (Signed)
Normal pulse ox, respiratory rate and clear lungs.  Suture removed from site of chest tube.  Discussed follow-up if new shortness of breath or new chest pain. ?

## 2022-01-28 DIAGNOSIS — F330 Major depressive disorder, recurrent, mild: Secondary | ICD-10-CM | POA: Diagnosis not present

## 2022-02-04 ENCOUNTER — Encounter: Payer: Self-pay | Admitting: *Deleted

## 2022-02-04 DIAGNOSIS — F330 Major depressive disorder, recurrent, mild: Secondary | ICD-10-CM | POA: Diagnosis not present

## 2022-02-11 DIAGNOSIS — F330 Major depressive disorder, recurrent, mild: Secondary | ICD-10-CM | POA: Diagnosis not present

## 2022-02-17 DIAGNOSIS — H40022 Open angle with borderline findings, high risk, left eye: Secondary | ICD-10-CM | POA: Diagnosis not present

## 2022-02-18 DIAGNOSIS — F330 Major depressive disorder, recurrent, mild: Secondary | ICD-10-CM | POA: Diagnosis not present

## 2022-03-04 ENCOUNTER — Encounter: Payer: Self-pay | Admitting: Family Medicine

## 2022-03-04 DIAGNOSIS — F330 Major depressive disorder, recurrent, mild: Secondary | ICD-10-CM | POA: Diagnosis not present

## 2022-03-05 DIAGNOSIS — R200 Anesthesia of skin: Secondary | ICD-10-CM | POA: Diagnosis not present

## 2022-03-06 ENCOUNTER — Ambulatory Visit (INDEPENDENT_AMBULATORY_CARE_PROVIDER_SITE_OTHER): Payer: BC Managed Care – PPO | Admitting: Family Medicine

## 2022-03-06 VITALS — BP 100/58 | HR 69 | Temp 97.7°F | Ht 75.0 in | Wt 175.1 lb

## 2022-03-06 DIAGNOSIS — S2249XD Multiple fractures of ribs, unspecified side, subsequent encounter for fracture with routine healing: Secondary | ICD-10-CM

## 2022-03-06 DIAGNOSIS — M25552 Pain in left hip: Secondary | ICD-10-CM | POA: Diagnosis not present

## 2022-03-06 NOTE — Patient Instructions (Signed)
Hip flexor - would add some stretches for the hip flexors - if not improving let me know and can try physical therapy  Lungs - safe to fly - but if you change your mind and want to get a repeat chest x-ray

## 2022-03-06 NOTE — Assessment & Plan Note (Signed)
Suspect some hip flexor pain. Advised stretching - he will try yoga. PT if not improving. Ibuprofen prn
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
## 2022-03-06 NOTE — Assessment & Plan Note (Signed)
Symptoms improving, but still with some SOB. Reviewed CXR from April with no pneumothorax. Discussed it is safe for air travel. As some persistent symptoms discussed we could consider repeat CXR - he will call if he would like to do this. But with improving symptoms and no new concerns suspect it will be stable.

## 2022-03-16 ENCOUNTER — Ambulatory Visit (INDEPENDENT_AMBULATORY_CARE_PROVIDER_SITE_OTHER): Payer: BC Managed Care – PPO | Admitting: Dermatology

## 2022-03-16 DIAGNOSIS — D485 Neoplasm of uncertain behavior of skin: Secondary | ICD-10-CM

## 2022-03-16 DIAGNOSIS — D239 Other benign neoplasm of skin, unspecified: Secondary | ICD-10-CM

## 2022-03-16 DIAGNOSIS — D225 Melanocytic nevi of trunk: Secondary | ICD-10-CM | POA: Diagnosis not present

## 2022-03-16 HISTORY — DX: Other benign neoplasm of skin, unspecified: D23.9

## 2022-03-16 NOTE — Progress Notes (Signed)
Follow-Up Visit   Subjective  Brett Hickman is a 36 y.o. male who presents for the following: Other (Shave removal x 3 of left low back, sacral area, left buttock).  The following portions of the chart were reviewed this encounter and updated as appropriate:   Tobacco  Allergies  Meds  Problems  Med Hx  Surg Hx  Fam Hx     Review of Systems:  No other skin or systemic complaints except as noted in HPI or Assessment and Plan.  Objective  Well appearing patient in no apparent distress; mood and affect are within normal limits.  A focused examination was performed including back. Relevant physical exam findings are noted in the Assessment and Plan.  Low back spinal 1.1 x 0.5 cm irregular brown macule  Left low back 6.0 cm lat to spine 0.6 x 0.5 cm irregular brown macule  Left sacral area 0.9 cm flesh colored papule   Assessment & Plan  Neoplasm of uncertain behavior of skin (3) Low back spinal Epidermal / dermal shaving  Lesion diameter (cm):  1.1 Informed consent: discussed and consent obtained   Timeout: patient name, date of birth, surgical site, and procedure verified   Procedure prep:  Patient was prepped and draped in usual sterile fashion Prep type:  Isopropyl alcohol Anesthesia: the lesion was anesthetized in a standard fashion   Anesthetic:  1% lidocaine w/ epinephrine 1-100,000 buffered w/ 8.4% NaHCO3 Instrument used: flexible razor blade   Hemostasis achieved with: pressure, aluminum chloride and electrodesiccation   Outcome: patient tolerated procedure well   Post-procedure details: sterile dressing applied and wound care instructions given   Dressing type: bandage and petrolatum    Specimen 1 - Surgical pathology Differential Diagnosis: Nevus vs dysplastic nevus Check Margins: No  Left low back 6.0 cm lat to spine Epidermal / dermal shaving  Lesion diameter (cm):  0.6 Informed consent: discussed and consent obtained   Timeout: patient name,  date of birth, surgical site, and procedure verified   Procedure prep:  Patient was prepped and draped in usual sterile fashion Prep type:  Isopropyl alcohol Anesthesia: the lesion was anesthetized in a standard fashion   Anesthetic:  1% lidocaine w/ epinephrine 1-100,000 buffered w/ 8.4% NaHCO3 Instrument used: flexible razor blade   Hemostasis achieved with: pressure, aluminum chloride and electrodesiccation   Outcome: patient tolerated procedure well   Post-procedure details: sterile dressing applied and wound care instructions given   Dressing type: bandage and petrolatum    Specimen 2 - Surgical pathology Differential Diagnosis: Nevus vs dysplastic nevus Check Margins: No  Left sacral area Epidermal / dermal shaving  Lesion diameter (cm):  0.9 Informed consent: discussed and consent obtained   Timeout: patient name, date of birth, surgical site, and procedure verified   Procedure prep:  Patient was prepped and draped in usual sterile fashion Prep type:  Isopropyl alcohol Anesthesia: the lesion was anesthetized in a standard fashion   Anesthetic:  1% lidocaine w/ epinephrine 1-100,000 buffered w/ 8.4% NaHCO3 Instrument used: flexible razor blade   Hemostasis achieved with: pressure, aluminum chloride and electrodesiccation   Outcome: patient tolerated procedure well   Post-procedure details: sterile dressing applied and wound care instructions given   Dressing type: bandage and petrolatum    Specimen 3 - Surgical pathology Differential Diagnosis: Irritated nevus R/O dysplasia Check Margins: No  Return in about 1 year (around 03/17/2023) for TBSE.  I, Ashok Cordia, CMA, am acting as scribe for Sarina Ser, MD . Documentation: I  have reviewed the above documentation for accuracy and completeness, and I agree with the above.  Sarina Ser, MD

## 2022-03-16 NOTE — Patient Instructions (Signed)
Wound Care Instructions  Cleanse wound gently with soap and water once a day then pat dry with clean gauze. Apply a thing coat of Petrolatum (petroleum jelly, "Vaseline") over the wound (unless you have an allergy to this). We recommend that you use a new, sterile tube of Vaseline. Do not pick or remove scabs. Do not remove the yellow or white "healing tissue" from the base of the wound.  Cover the wound with fresh, clean, nonstick gauze and secure with paper tape. You may use Band-Aids in place of gauze and tape if the would is small enough, but would recommend trimming much of the tape off as there is often too much. Sometimes Band-Aids can irritate the skin.  You should call the office for your biopsy report after 1 week if you have not already been contacted.  If you experience any problems, such as abnormal amounts of bleeding, swelling, significant bruising, significant pain, or evidence of infection, please call the office immediately.  FOR ADULT SURGERY PATIENTS: If you need something for pain relief you may take 1 extra strength Tylenol (acetaminophen) AND 2 Ibuprofen (200mg each) together every 4 hours as needed for pain. (do not take these if you are allergic to them or if you have a reason you should not take them.) Typically, you may only need pain medication for 1 to 3 days.    Due to recent changes in healthcare laws, you may see results of your pathology and/or laboratory studies on MyChart before the doctors have had a chance to review them. We understand that in some cases there may be results that are confusing or concerning to you. Please understand that not all results are received at the same time and often the doctors may need to interpret multiple results in order to provide you with the best plan of care or course of treatment. Therefore, we ask that you please give us 2 business days to thoroughly review all your results before contacting the office for clarification. Should we  see a critical lab result, you will be contacted sooner.   If You Need Anything After Your Visit  If you have any questions or concerns for your doctor, please call our main line at [phone] and press option 4 to reach your doctor's medical assistant. If no one answers, please leave a voicemail as directed and we will return your call as soon as possible. Messages left after 4 pm will be answered the following business day.   You may also send us a message via MyChart. We typically respond to MyChart messages within 1-2 business days.  For prescription refills, please ask your pharmacy to contact our office. Our fax number is [phone].  If you have an urgent issue when the clinic is closed that cannot wait until the next business day, you can page your doctor at the number below.    Please note that while we do our best to be available for urgent issues outside of office hours, we are not available 24/7.   If you have an urgent issue and are unable to reach us, you may choose to seek medical care at your doctor's office, retail clinic, urgent care center, or emergency room.  If you have a medical emergency, please immediately call 911 or go to the emergency department.  Pager Numbers  - Dr. Kowalski: [phone]  - Dr. Moye: [phone]  - Dr. Stewart: [phone]  In the event of inclement weather, please call our main line at [phone]   for an update on the status of any delays or closures.  Dermatology Medication Tips: Please keep the boxes that topical medications come in in order to help keep track of the instructions about where and how to use these. Pharmacies typically print the medication instructions only on the boxes and not directly on the medication tubes.   If your medication is too expensive, please contact our office at [phone] option 4 or send us a message through MyChart.   We are unable to tell what your co-pay for medications will be in advance  as this is different depending on your insurance coverage. However, we may be able to find a substitute medication at lower cost or fill out paperwork to get insurance to cover a needed medication.   If a prior authorization is required to get your medication covered by your insurance company, please allow us 1-2 business days to complete this process.  Drug prices often vary depending on where the prescription is filled and some pharmacies may offer cheaper prices.  The website www.goodrx.com contains coupons for medications through different pharmacies. The prices here do not account for what the cost may be with help from insurance (it may be cheaper with your insurance), but the website can give you the price if you did not use any insurance.  - You can print the associated coupon and take it with your prescription to the pharmacy.  - You may also stop by our office during regular business hours and pick up a GoodRx coupon card.  - If you need your prescription sent electronically to a different pharmacy, notify our office through [facility] MyChart or by phone at [phone] option 4.     Si Usted Necesita Algo Despus de Su Visita  Tambin puede enviarnos un mensaje a travs de MyChart. Por lo general respondemos a los mensajes de MyChart en el transcurso de 1 a 2 das hbiles.  Para renovar recetas, por favor pida a su farmacia que se ponga en contacto con nuestra oficina. Nuestro nmero de fax es el [phone].  Si tiene un asunto urgente cuando la clnica est cerrada y que no puede esperar hasta el siguiente da hbil, puede llamar/localizar a su doctor(a) al nmero que aparece a continuacin.   Por favor, tenga en cuenta que aunque hacemos todo lo posible para estar disponibles para asuntos urgentes fuera del horario de oficina, no estamos disponibles las 24 horas del da, los 7 das de la semana.   Si tiene un problema urgente y no puede comunicarse con nosotros, puede optar  por buscar atencin mdica  en el consultorio de su doctor(a), en una clnica privada, en un centro de atencin urgente o en una sala de emergencias.  Si tiene una emergencia mdica, por favor llame inmediatamente al 911 o vaya a la sala de emergencias.  Nmeros de bper  - Dr. Kowalski: [phone]  - Dra. Moye: [phone]  - Dra. Stewart: [phone]  En caso de inclemencias del tiempo, por favor llame a nuestra lnea principal al [phone] para una actualizacin sobre el estado de cualquier retraso o cierre.  Consejos para la medicacin en dermatologa: Por favor, guarde las cajas en las que vienen los medicamentos de uso tpico para ayudarle a seguir las instrucciones sobre dnde y cmo usarlos. Las farmacias generalmente imprimen las instrucciones del medicamento slo en las cajas y no directamente en los tubos del medicamento.   Si su medicamento es muy caro, por favor, pngase en contacto con nuestra   oficina llamando al [phone] y presione la opcin 4 o envenos un mensaje a travs de MyChart.   No podemos decirle cul ser su copago por los medicamentos por adelantado ya que esto es diferente dependiendo de la cobertura de su seguro. Sin embargo, es posible que podamos encontrar un medicamento sustituto a menor costo o llenar un formulario para que el seguro cubra el medicamento que se considera necesario.   Si se requiere una autorizacin previa para que su compaa de seguros cubra su medicamento, por favor permtanos de 1 a 2 das hbiles para completar este proceso.  Los precios de los medicamentos varan con frecuencia dependiendo del lugar de dnde se surte la receta y alguna farmacias pueden ofrecer precios ms baratos.  El sitio web www.goodrx.com tiene cupones para medicamentos de diferentes farmacias. Los precios aqu no tienen en cuenta lo que podra costar con la ayuda del seguro (puede ser ms barato con su seguro), pero el sitio web puede darle el precio si  no utiliz ningn seguro.  - Puede imprimir el cupn correspondiente y llevarlo con su receta a la farmacia.  - Tambin puede pasar por nuestra oficina durante el horario de atencin regular y recoger una tarjeta de cupones de GoodRx.  - Si necesita que su receta se enve electrnicamente a una farmacia diferente, informe a nuestra oficina a travs de MyChart de [facility] o por telfono llamando al [phone] y presione la opcin 4.  

## 2022-03-17 ENCOUNTER — Encounter: Payer: Self-pay | Admitting: Dermatology

## 2022-03-23 ENCOUNTER — Telehealth: Payer: Self-pay

## 2022-03-23 NOTE — Telephone Encounter (Signed)
Discussed biopsy results with pt  °

## 2022-04-03 DIAGNOSIS — J351 Hypertrophy of tonsils: Secondary | ICD-10-CM | POA: Diagnosis not present

## 2022-04-03 DIAGNOSIS — J301 Allergic rhinitis due to pollen: Secondary | ICD-10-CM | POA: Diagnosis not present

## 2022-04-03 DIAGNOSIS — J342 Deviated nasal septum: Secondary | ICD-10-CM | POA: Diagnosis not present

## 2022-04-23 ENCOUNTER — Encounter: Payer: Self-pay | Admitting: Family Medicine

## 2022-04-23 ENCOUNTER — Ambulatory Visit (INDEPENDENT_AMBULATORY_CARE_PROVIDER_SITE_OTHER): Payer: BC Managed Care – PPO | Admitting: Family Medicine

## 2022-04-23 VITALS — BP 98/70 | HR 88 | Temp 97.6°F | Ht 75.0 in | Wt 175.1 lb

## 2022-04-23 DIAGNOSIS — M549 Dorsalgia, unspecified: Secondary | ICD-10-CM | POA: Diagnosis not present

## 2022-04-23 DIAGNOSIS — J329 Chronic sinusitis, unspecified: Secondary | ICD-10-CM

## 2022-04-23 DIAGNOSIS — Z8781 Personal history of (healed) traumatic fracture: Secondary | ICD-10-CM

## 2022-04-23 DIAGNOSIS — M542 Cervicalgia: Secondary | ICD-10-CM | POA: Diagnosis not present

## 2022-04-23 NOTE — Assessment & Plan Note (Signed)
He notes some mild neck pain that started recently as well.  We will have physical therapy address this 2.  This is mild and only happens when he is at his computer station.

## 2022-04-23 NOTE — Assessment & Plan Note (Signed)
Has ENT surgery for septum scheduled in August

## 2022-04-23 NOTE — Progress Notes (Signed)
Subjective:     Brett Hickman is a 36 y.o. male presenting for Back Pain (Sharp pain below R shoulder blade where he had an incision)     Back Pain    #Back pain - sharp pain at the incision site - breathing is going well - started after putting girls to bed (carrying - 40 lbs)  - worse when picking things up or crossing his arms - typically a sharp sensation which lasts for a few seconds and resolves - different things trigger it  - has not tried any stretches - has not returned to regular exercise - just walking currently  - previously was doing golf - but has not been doing this since PT  Review of Systems  Musculoskeletal:  Positive for back pain.     Social History   Tobacco Use  Smoking Status Never  Smokeless Tobacco Never        Objective:    BP Readings from Last 3 Encounters:  04/23/22 98/70  03/06/22 (!) 100/58  01/26/22 92/60   Wt Readings from Last 3 Encounters:  04/23/22 175 lb 2 oz (79.4 kg)  03/06/22 175 lb 2 oz (79.4 kg)  01/26/22 173 lb (78.5 kg)    BP 98/70   Pulse 88   Temp 97.6 F (36.4 C) (Temporal)   Ht '6\' 3"'$  (1.905 m)   Wt 175 lb 2 oz (79.4 kg)   SpO2 97%   BMI 21.89 kg/m    Physical Exam Constitutional:      Appearance: Normal appearance. He is not ill-appearing or diaphoretic.  HENT:     Right Ear: External ear normal.     Left Ear: External ear normal.     Nose: Nose normal.  Eyes:     General: No scleral icterus.    Extraocular Movements: Extraocular movements intact.     Conjunctiva/sclera: Conjunctivae normal.  Cardiovascular:     Rate and Rhythm: Normal rate and regular rhythm.     Heart sounds: No murmur heard. Pulmonary:     Effort: Pulmonary effort is normal. No respiratory distress.     Breath sounds: Normal breath sounds. No wheezing.  Musculoskeletal:     Cervical back: Neck supple.     Comments: Back:  Inspection: slight curve in the T spine, right side hypertrophy. Scar from incision healing  well Palpation: no spinous or paraspinous ttp no rhomboid ttp ROM: normal arm movement with some discomfort   Skin:    General: Skin is warm and dry.  Neurological:     Mental Status: He is alert. Mental status is at baseline.  Psychiatric:        Mood and Affect: Mood normal.           Assessment & Plan:   Problem List Items Addressed This Visit       Respiratory   Recurrent sinusitis    Has ENT surgery for septum scheduled in August        Other   Upper back pain on right side - Primary    Patient with history of rib fracture including surgery, now with back pain.  He enjoys golf and has not been able to do this since his injury.  Suspect is either rhomboid versus LAT.  He may also have some underlying scoliosis.  Handout for upper back pain PT provided.  He will work on this while out of town, plan to initiate physical therapy to help get him back to playing golf without  pain when he returns.      Relevant Orders   Ambulatory referral to Physical Therapy   Neck pain    He notes some mild neck pain that started recently as well.  We will have physical therapy address this 2.  This is mild and only happens when he is at his computer station.      Relevant Orders   Ambulatory referral to Physical Therapy   Other Visit Diagnoses     History of rib fracture       Relevant Orders   Ambulatory referral to Physical Therapy        No follow-ups on file.  Lesleigh Noe, MD

## 2022-04-23 NOTE — Patient Instructions (Addendum)
#  Referral I have placed a referral to a specialist for you. You should receive a phone call from the specialty office. Make sure your voicemail is not full and that if you are able to answer your phone to unknown or new numbers.   It may take up to 2 weeks to hear about the referral. If you do not hear anything in 2 weeks, please call our office and ask to speak with the referral coordinator.    Handout for exercise Mikki Santee and Leroy Sea - PT exercises

## 2022-04-23 NOTE — Assessment & Plan Note (Signed)
Patient with history of rib fracture including surgery, now with back pain.  He enjoys golf and has not been able to do this since his injury.  Suspect is either rhomboid versus LAT.  He may also have some underlying scoliosis.  Handout for upper back pain PT provided.  He will work on this while out of town, plan to initiate physical therapy to help get him back to playing golf without pain when he returns.

## 2022-05-27 ENCOUNTER — Other Ambulatory Visit: Payer: Self-pay

## 2022-05-27 ENCOUNTER — Ambulatory Visit: Payer: BC Managed Care – PPO | Attending: Family Medicine | Admitting: Physical Therapy

## 2022-05-27 ENCOUNTER — Encounter: Payer: Self-pay | Admitting: Physical Therapy

## 2022-05-27 DIAGNOSIS — M546 Pain in thoracic spine: Secondary | ICD-10-CM | POA: Diagnosis not present

## 2022-05-27 DIAGNOSIS — Z8781 Personal history of (healed) traumatic fracture: Secondary | ICD-10-CM | POA: Insufficient documentation

## 2022-05-27 DIAGNOSIS — M549 Dorsalgia, unspecified: Secondary | ICD-10-CM | POA: Diagnosis not present

## 2022-05-27 DIAGNOSIS — M542 Cervicalgia: Secondary | ICD-10-CM | POA: Diagnosis not present
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
## 2022-05-28 ENCOUNTER — Encounter: Payer: Self-pay | Admitting: Physical Therapy

## 2022-06-01 ENCOUNTER — Ambulatory Visit: Payer: BC Managed Care – PPO | Admitting: Physical Therapy

## 2022-06-01 ENCOUNTER — Encounter: Payer: Self-pay | Admitting: Physical Therapy

## 2022-06-01 DIAGNOSIS — Z8781 Personal history of (healed) traumatic fracture: Secondary | ICD-10-CM | POA: Diagnosis not present

## 2022-06-01 DIAGNOSIS — M549 Dorsalgia, unspecified: Secondary | ICD-10-CM | POA: Diagnosis not present

## 2022-06-01 DIAGNOSIS — M542 Cervicalgia: Secondary | ICD-10-CM | POA: Diagnosis not present

## 2022-06-01 DIAGNOSIS — M546 Pain in thoracic spine: Secondary | ICD-10-CM
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
## 2022-06-04 ENCOUNTER — Encounter: Payer: BC Managed Care – PPO | Admitting: Physical Therapy

## 2022-06-09 ENCOUNTER — Ambulatory Visit: Payer: BC Managed Care – PPO | Attending: Family Medicine | Admitting: Physical Therapy

## 2022-06-09 ENCOUNTER — Encounter: Payer: Self-pay | Admitting: Physical Therapy

## 2022-06-09 DIAGNOSIS — M546 Pain in thoracic spine: Secondary | ICD-10-CM | POA: Insufficient documentation
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
## 2022-06-11 ENCOUNTER — Encounter: Payer: BC Managed Care – PPO | Admitting: Physical Therapy

## 2022-06-12 DIAGNOSIS — J342 Deviated nasal septum: Secondary | ICD-10-CM | POA: Diagnosis not present

## 2022-06-12 DIAGNOSIS — J351 Hypertrophy of tonsils: Secondary | ICD-10-CM | POA: Diagnosis not present

## 2022-06-12 DIAGNOSIS — J301 Allergic rhinitis due to pollen: Secondary | ICD-10-CM | POA: Diagnosis not present

## 2022-06-15 ENCOUNTER — Encounter: Payer: Self-pay | Admitting: Physical Therapy

## 2022-06-15 ENCOUNTER — Ambulatory Visit: Payer: BC Managed Care – PPO | Admitting: Physical Therapy

## 2022-06-15 DIAGNOSIS — M546 Pain in thoracic spine: Secondary | ICD-10-CM | POA: Diagnosis not present
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
## 2022-06-17 ENCOUNTER — Encounter: Payer: BC Managed Care – PPO | Admitting: Physical Therapy

## 2022-06-22 ENCOUNTER — Encounter: Payer: Self-pay | Admitting: Otolaryngology

## 2022-06-26 NOTE — Discharge Instructions (Signed)
[facility] ENDOSCOPIC SINUS SURGERY [facility]  What is Functional Endoscopic Sinus Surgery?  The Surgery involves making the natural openings of the sinuses larger by removing the bony partitions that separate the sinuses from the nasal cavity.  The natural sinus lining is preserved as much as possible to allow the sinuses to resume normal function after the surgery.  In some patients nasal polyps (excessively swollen lining of the sinuses) may be removed to relieve obstruction of the sinus openings.  The surgery is performed through the nose using lighted scopes, which eliminates the need for incisions on the face.  A septoplasty is a different procedure which is sometimes performed with sinus surgery.  It involves straightening the boy partition that separates the two sides of your nose.  A crooked or deviated septum may need repair if is obstructing the sinuses or nasal airflow.  Turbinate reduction is also often performed during sinus surgery.  The turbinates are bony proturberances from the side walls of the nose which swell and can obstruct the nose in patients with sinus and allergy problems.  Their size can be surgically reduced to help relieve nasal obstruction.  What Can Sinus Surgery Do For Me?  Sinus surgery can reduce the frequency of sinus infections requiring antibiotic treatment.  This can provide improvement in nasal congestion, post-nasal drainage, facial pressure and nasal obstruction.  Surgery will NOT prevent you from ever having an infection again, so it usually only for patients who get infections 4 or more times yearly requiring antibiotics, or for infections that do not clear with antibiotics.  It will not cure nasal allergies, so patients with allergies may still require medication to treat their allergies after surgery. Surgery may improve headaches related to sinusitis, however, some people will continue to  require medication to control sinus headaches related to allergies.  Surgery will do nothing for other forms of headache (migraine, tension or cluster).  What Are the Risks of Endoscopic Sinus Surgery?  Current techniques allow surgery to be performed safely with little risk, however, there are rare complications that patients should be aware of.  Because the sinuses are located around the eyes, there is risk of eye injury, including blindness, though again, this would be quite rare. This is usually a result of bleeding behind the eye during surgery, which can effect vision, though there are treatments to protect the vision and prevent permanent injury. More serious complications would include bleeding inside the brain cavity or damage to the brain.This happens when the fluid around the brain leaks out into the sinus cavity.  Again, all of these complications are uncommon, and spinal fluid leaks can be safely managed surgically if they occur.  The most common complication of sinus surgery is bleeding from the nose, which may require packing or cauterization of the nose.  Patients with polyps may experience recurrence of the polyps that would require revision surgery.  Alterations of sense of smell or injury to the tear ducts are also rare complications.   What is the Surgery Like, and what is the Recovery?  The Surgery usually takes a couple of hours to perform, and is usually performed under a general anesthetic (completely asleep).  Patients are usually discharged home after a couple of hours.  Sometimes during surgery it is necessary to pack the nose to control bleeding, and the packing is left in place for 24 - 48 hours, and removed by your surgeon.  If   a septoplasty was performed during the procedure, there is often a splint placed which must be removed after 5-7 days.   Discomfort: Pain is usually mild to moderate, and can be controlled by prescription pain medication or acetaminophen (Tylenol).   Aspirin, Ibuprofen (Advil, Motrin), or Naprosyn (Aleve) should be avoided, as they can cause increased bleeding.  Most patients feel sinus pressure like they have a bad head cold for several days.  Sleeping with your head elevated can help reduce swelling and facial pressure, as can ice packs over the face.  A humidifier may be helpful to keep the mucous and blood from drying in the nose.   Diet: There are no specific diet restrictions, however, you should generally start with clear liquids and a light diet of bland foods because the anesthetic can cause some nausea.  Advance your diet depending on how your stomach feels.  Taking your pain medication with food will often help reduce stomach upset which pain medications can cause.  Nasal Saline Irrigation: It is important to remove blood clots and dried mucous from the nose as it is healing.  This is done by having you irrigate the nose at least 3 - 4 times daily with a salt water solution.  We recommend using NeilMed Sinus Rinse (available at the drug store).  Fill the squeeze bottle with the solution, bend over a sink, and insert the tip of the squeeze bottle into the nose  of an inch.  Point the tip of the squeeze bottle towards the inside corner of the eye on the same side your irrigating.  Squeeze the bottle and gently irrigate the nose.  If you bend forward as you do this, most of the fluid will flow back out of the nose, instead of down your throat.   The solution should be warm, near body temperature, when you irrigate.   Each time you irrigate, you should use a full squeeze bottle.   Note that if you are instructed to use Nasal Steroid Sprays at any time after your surgery, irrigate with saline BEFORE using the steroid spray, so you do not wash it all out of the nose. Another product, Nasal Saline Gel (such as AYR Nasal Saline Gel) can be applied in each nostril 3 - 4 times daily to moisture the nose and reduce scabbing or crusting.  Bleeding:   Bloody drainage from the nose can be expected for several days, and patients are instructed to irrigate their nose frequently with salt water to help remove mucous and blood clots.  The drainage may be dark red or brown, though some fresh blood may be seen intermittently, especially after irrigation.  Do not blow you nose, as bleeding may occur. If you must sneeze, keep your mouth open to allow air to escape through your mouth.  If heavy bleeding occurs: Irrigate the nose with saline to rinse out clots, then spray the nose 3 - 4 times with Afrin Nasal Decongestant Spray.  The spray will constrict the blood vessels to slow bleeding.  Pinch the lower half of your nose shut to apply pressure, and lay down with your head elevated.  Ice packs over the nose may help as well. If bleeding persists despite these measures, you should notify your doctor.  Do not use the Afrin routinely to control nasal congestion after surgery, as it can result in worsening congestion and may affect healing.     Activity: Return to work varies among patients. Most patients will be out   of work at least 5 - 7 days to recover.  Patient may return to work after they are off of narcotic pain medication, and feeling well enough to perform the functions of their job.  Patients must avoid heavy lifting (over 10 pounds) or strenuous physical for 2 weeks after surgery, so your employer may need to assign you to light duty, or keep you out of work longer if light duty is not possible.  NOTE: you should not drive, operate dangerous machinery, do any mentally demanding tasks or make any important legal or financial decisions while on narcotic pain medication and recovering from the general anesthetic.    Call Your Doctor Immediately if You Have Any of the Following: Bleeding that you cannot control with the above measures Loss of vision, double vision, bulging of the eye or black eyes. Fever over 101 degrees Neck stiffness with severe headache,  fever, nausea and change in mental state. You are always encouraged to call anytime with concerns, however, please call with requests for pain medication refills during office hours.  Office Endoscopy: During follow-up visits your doctor will remove any packing or splints that may have been placed and evaluate and clean your sinuses endoscopically.  Topical anesthetic will be used to make this as comfortable as possible, though you may want to take your pain medication prior to the visit.  How often this will need to be done varies from patient to patient.  After complete recovery from the surgery, you may need follow-up endoscopy from time to time, particularly if there is concern of recurrent infection or nasal polyps.  

## 2022-06-29 ENCOUNTER — Ambulatory Visit: Payer: BC Managed Care – PPO | Admitting: Physical Therapy

## 2022-07-02 ENCOUNTER — Encounter: Payer: BC Managed Care – PPO | Admitting: Physical Therapy

## 2022-07-03 IMAGING — DX DG CHEST 2V
2 series · 2 of 2 positions shown · non-contrast
Comparison: None.

CLINICAL DATA: 35-year-old male with a history of hemopneumothorax

EXAM:
CHEST - 2 VIEW

[chest pa]
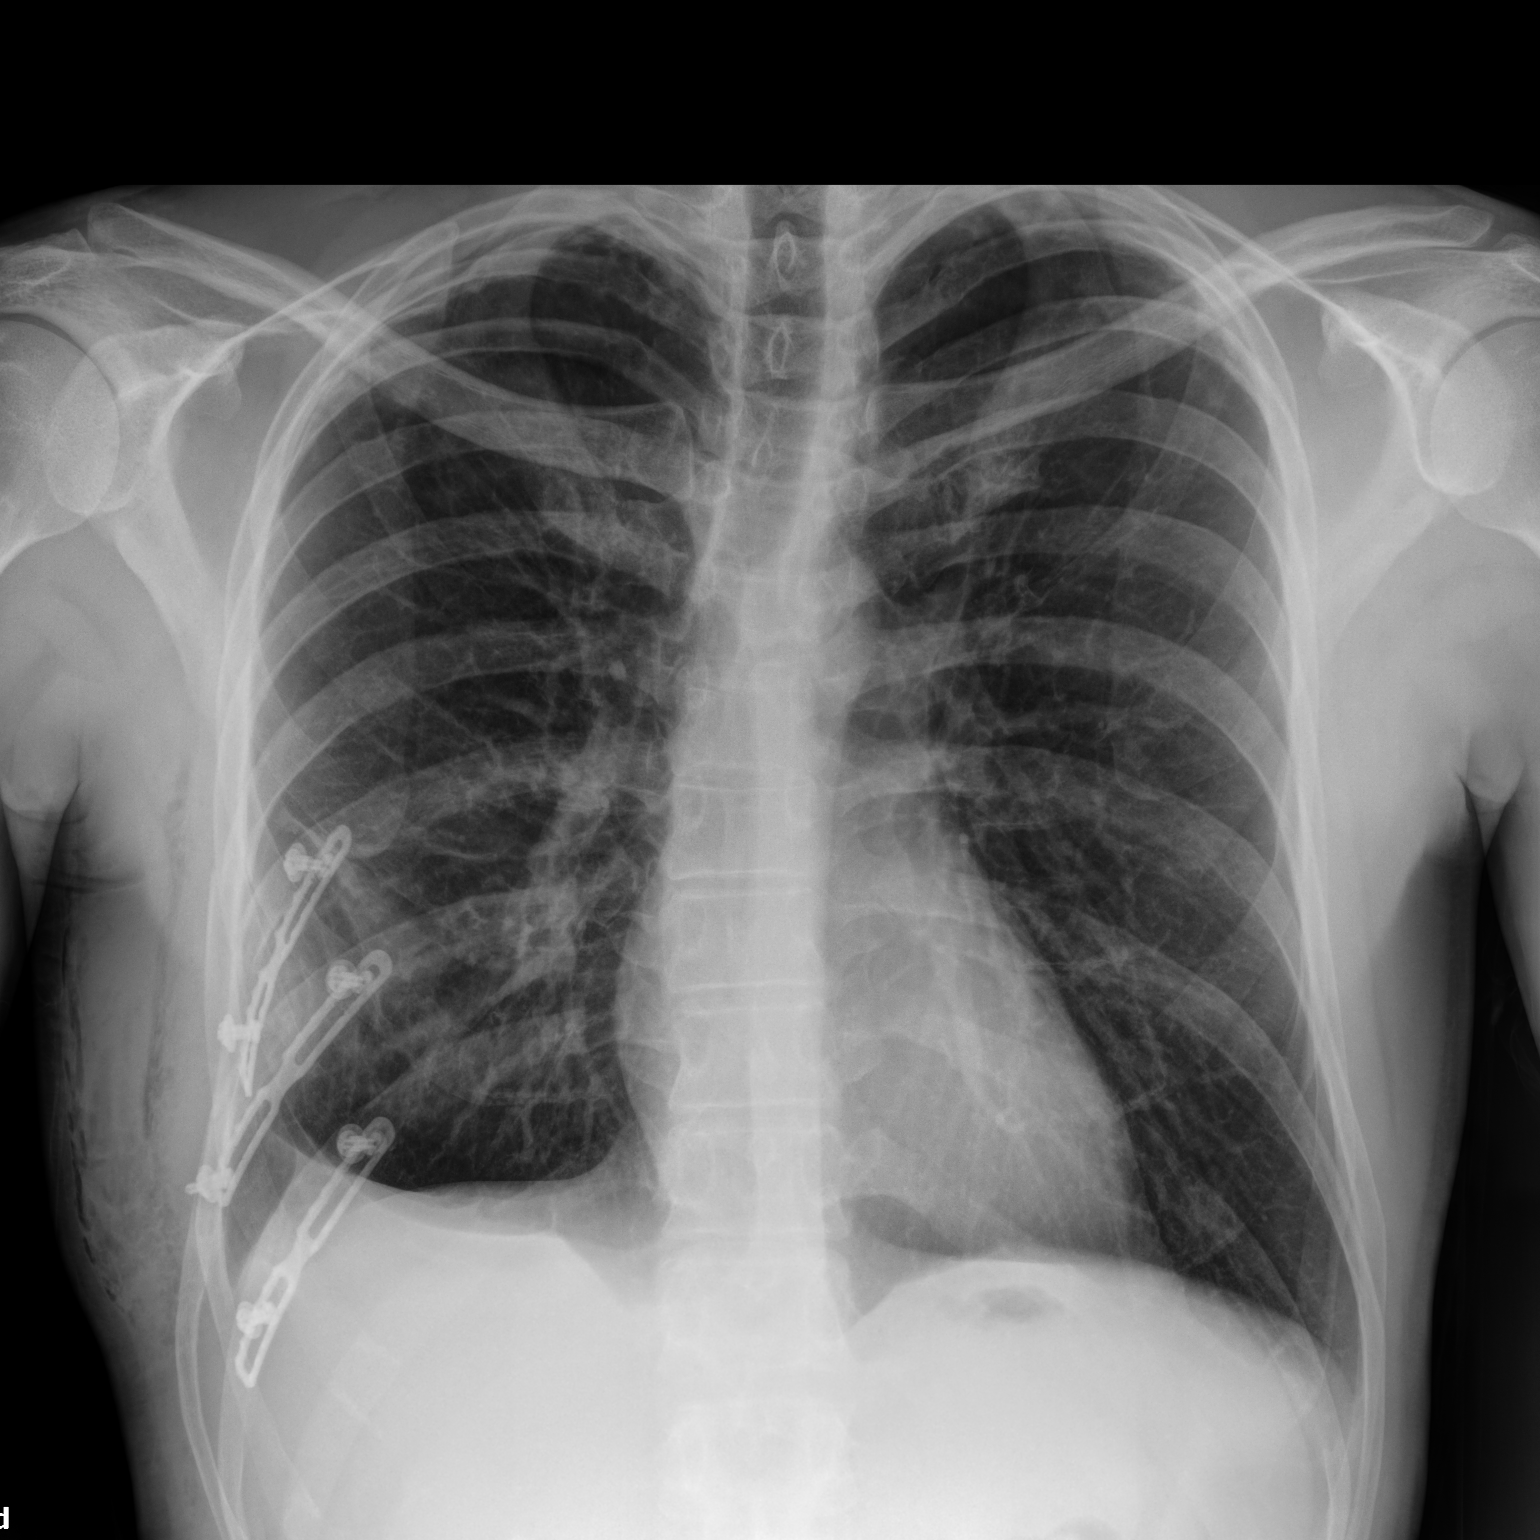

[chest lat]
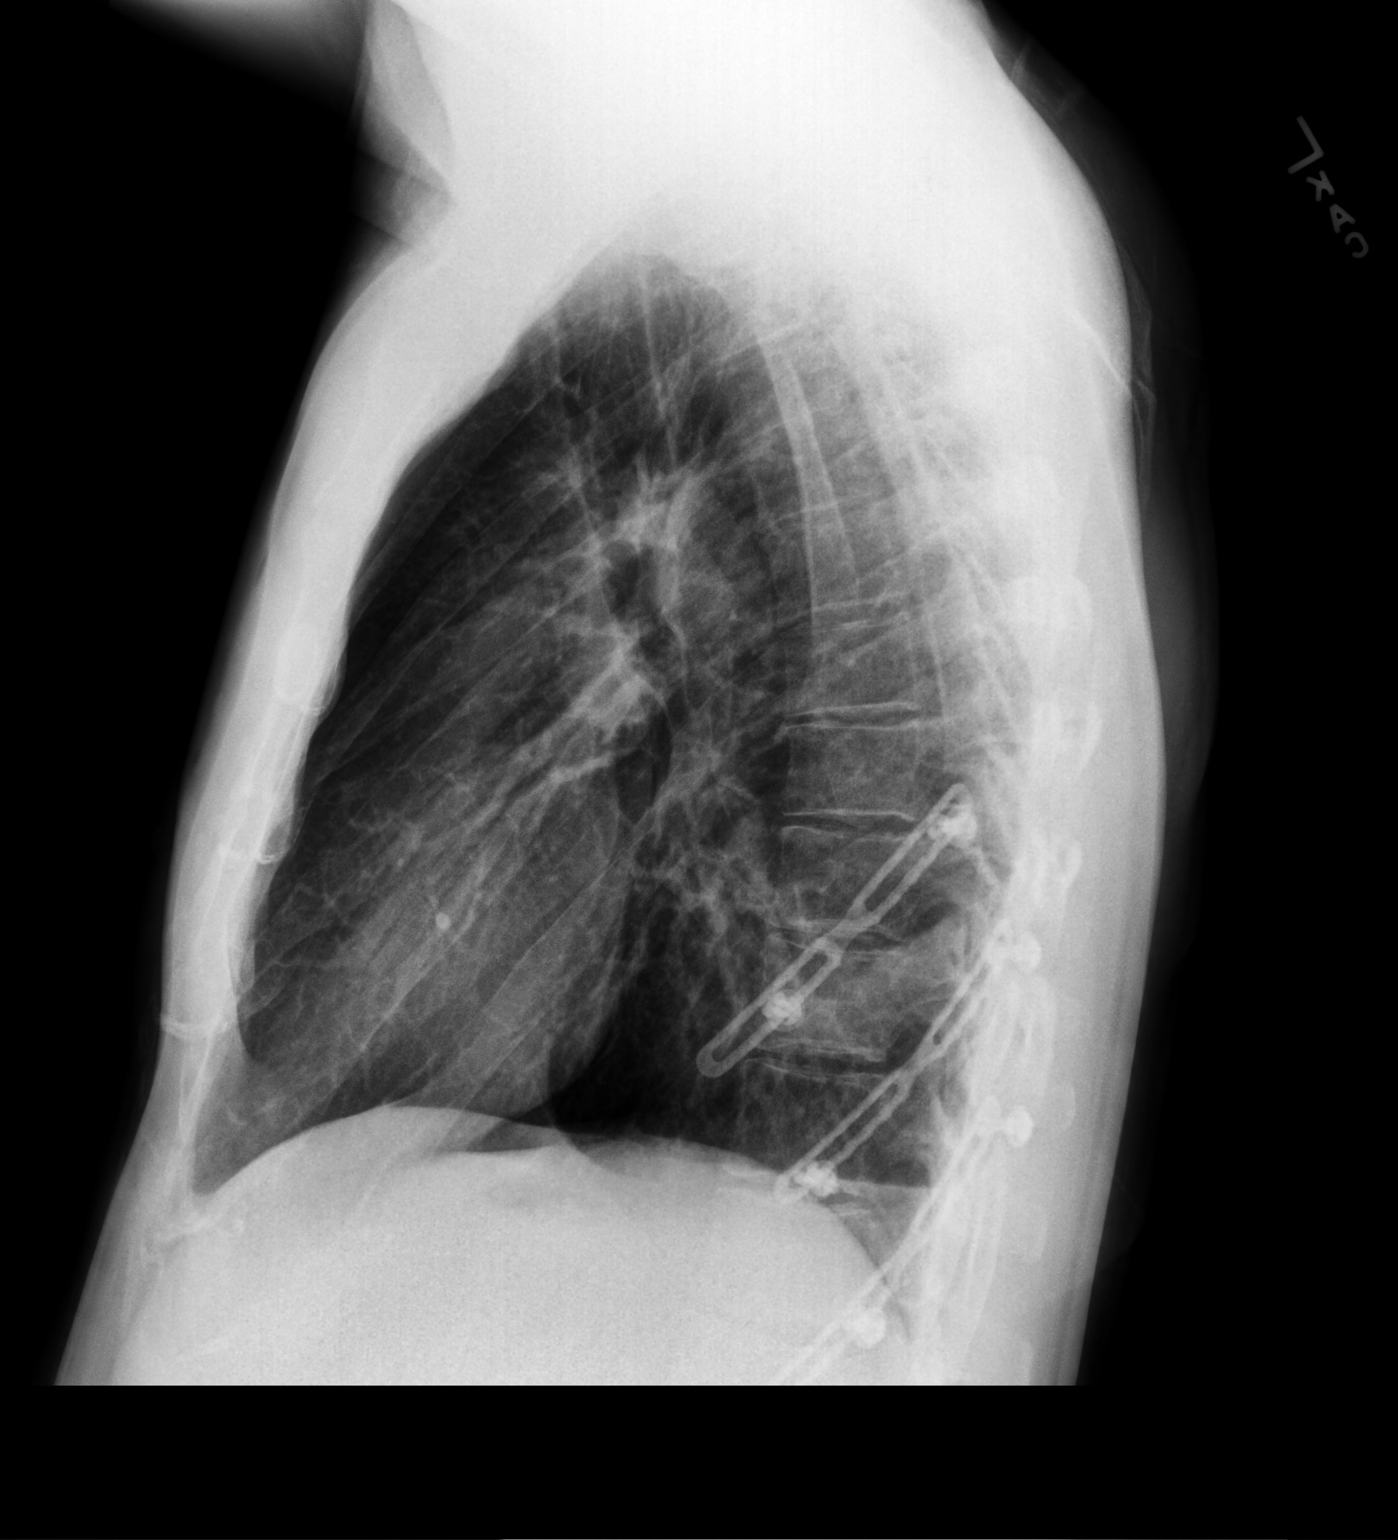

[2 of 2 positions shown; findings below may reference images not displayed]

FINDINGS: Cardiomediastinal silhouette within normal limits.

Blunting at the right costophrenic angle with meniscus on the
lateral view. Pleuroparenchymal thickening/scarring at the bilateral
lung apices with no visualized pneumothorax. Patchy opacities in the
right lower lung.

Left lung well aerated.

Surgical changes of sequential rib internal fixation with plate and
screw, 9, 10, 11.

Gas within the soft tissues overlying the right chest wall.
IMPRESSION: Patchy airspace disease in the right lower lung with blunting at the
right costophrenic angle, presumably posttraumatic given the
history.

Surgical changes of sequential ORIF of right ribs 9, 10, 11. Gas
within the overlying soft tissues of the chest wall, potentially
postsurgical although no comparison is available.

## 2022-07-07 ENCOUNTER — Encounter: Payer: BC Managed Care – PPO | Admitting: Physical Therapy

## 2022-07-08 ENCOUNTER — Ambulatory Visit: Payer: BC Managed Care – PPO | Attending: Family Medicine | Admitting: Physical Therapy

## 2022-07-08 ENCOUNTER — Encounter: Payer: Self-pay | Admitting: Physical Therapy

## 2022-07-08 DIAGNOSIS — M546 Pain in thoracic spine: Secondary | ICD-10-CM | POA: Diagnosis not present
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
## 2022-07-09 ENCOUNTER — Encounter: Payer: BC Managed Care – PPO | Admitting: Physical Therapy

## 2022-07-13 ENCOUNTER — Encounter: Payer: Self-pay | Admitting: Physical Therapy

## 2022-07-13 ENCOUNTER — Ambulatory Visit: Payer: BC Managed Care – PPO | Admitting: Physical Therapy

## 2022-07-13 ENCOUNTER — Encounter: Payer: BC Managed Care – PPO | Admitting: Physical Therapy

## 2022-07-13 DIAGNOSIS — M546 Pain in thoracic spine: Secondary | ICD-10-CM | POA: Diagnosis not present
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
## 2022-07-20 ENCOUNTER — Telehealth: Payer: Self-pay | Admitting: Physical Therapy

## 2022-07-20 NOTE — Telephone Encounter (Signed)
Phone called entered in error.

## 2022-07-21 ENCOUNTER — Ambulatory Visit: Payer: BC Managed Care – PPO | Admitting: Physical Therapy

## 2022-07-21 DIAGNOSIS — M546 Pain in thoracic spine: Secondary | ICD-10-CM
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
## 2022-07-23 ENCOUNTER — Encounter: Payer: BC Managed Care – PPO | Admitting: Physical Therapy

## 2022-07-27 ENCOUNTER — Encounter: Payer: BC Managed Care – PPO | Admitting: Physical Therapy

## 2022-07-30 ENCOUNTER — Encounter: Payer: BC Managed Care – PPO | Admitting: Physical Therapy

## 2022-08-04 ENCOUNTER — Encounter: Payer: BC Managed Care – PPO | Admitting: Physical Therapy

## 2022-08-04 DIAGNOSIS — H40003 Preglaucoma, unspecified, bilateral: Secondary | ICD-10-CM | POA: Diagnosis not present

## 2022-08-06 ENCOUNTER — Encounter: Payer: BC Managed Care – PPO | Admitting: Physical Therapy

## 2022-08-25 DIAGNOSIS — J351 Hypertrophy of tonsils: Secondary | ICD-10-CM | POA: Diagnosis not present

## 2022-08-25 DIAGNOSIS — J342 Deviated nasal septum: Secondary | ICD-10-CM | POA: Diagnosis not present

## 2022-08-25 DIAGNOSIS — J301 Allergic rhinitis due to pollen: Secondary | ICD-10-CM | POA: Diagnosis not present

## 2022-09-16 ENCOUNTER — Ambulatory Visit: Payer: BC Managed Care – PPO | Admitting: Anesthesiology

## 2022-09-16 ENCOUNTER — Encounter
Admission: RE | Disposition: A | Discharge status: Home / Self Care | Payer: Self-pay | Source: Home / Self Care | Attending: Otolaryngology

## 2022-09-16 ENCOUNTER — Other Ambulatory Visit: Payer: Self-pay

## 2022-09-16 ENCOUNTER — Ambulatory Visit
Admission: RE | Admit: 2022-09-16 | Discharge: 2022-09-16 | Disposition: A | Discharge status: Home / Self Care | Payer: BC Managed Care – PPO | Attending: Otolaryngology | Admitting: Otolaryngology

## 2022-09-16 ENCOUNTER — Encounter: Payer: Self-pay | Admitting: Otolaryngology

## 2022-09-16 DIAGNOSIS — J342 Deviated nasal septum: Secondary | ICD-10-CM | POA: Diagnosis not present

## 2022-09-16 DIAGNOSIS — J343 Hypertrophy of nasal turbinates: Secondary | ICD-10-CM | POA: Diagnosis not present

## 2022-09-16 HISTORY — PX: TURBINATE REDUCTION: SHX6157

## 2022-09-16 HISTORY — PX: SEPTOPLASTY: SHX2393

## 2022-09-16 SURGERY — SEPTOPLASTY, NOSE
Anesthesia: General | Site: Nose

## 2022-09-16 MED ORDER — ONDANSETRON HCL 4 MG PO TABS: 4.0000 mg | ORAL_TABLET | Freq: Three times a day (TID) | ORAL | 0 refills | Status: DC | PRN

## 2022-09-16 MED ORDER — BACITRACIN 500 UNIT/GM EX OINT
TOPICAL_OINTMENT | CUTANEOUS | Status: DC | PRN
  Administered 2022-09-16: 1 via TOPICAL

## 2022-09-16 MED ORDER — LIDOCAINE HCL (CARDIAC) PF 100 MG/5ML IV SOSY
PREFILLED_SYRINGE | INTRAVENOUS | Status: DC | PRN
  Administered 2022-09-16: 60 mg via INTRAVENOUS

## 2022-09-16 MED ORDER — MIDAZOLAM HCL 5 MG/5ML IJ SOLN
INTRAMUSCULAR | Status: DC | PRN
  Administered 2022-09-16: 2 mg via INTRAVENOUS

## 2022-09-16 MED ORDER — ONDANSETRON HCL 4 MG/2ML IJ SOLN
4.0000 mg | Freq: Once | INTRAMUSCULAR | Status: AC | PRN
  Administered 2022-09-16: 4 mg via INTRAVENOUS

## 2022-09-16 MED ORDER — FENTANYL CITRATE PF 50 MCG/ML IJ SOSY: 25.0000 ug | PREFILLED_SYRINGE | INTRAMUSCULAR | Status: DC | PRN

## 2022-09-16 MED ORDER — OXYCODONE-ACETAMINOPHEN 5-325 MG PO TABS: 1.0000 | ORAL_TABLET | ORAL | 0 refills | Status: DC | PRN

## 2022-09-16 MED ORDER — OXYMETAZOLINE HCL 0.05 % NA SOLN
NASAL | Status: DC | PRN
  Administered 2022-09-16: 1 via TOPICAL

## 2022-09-16 MED ORDER — DEXAMETHASONE SODIUM PHOSPHATE 4 MG/ML IJ SOLN
INTRAMUSCULAR | Status: DC | PRN
  Administered 2022-09-16: 8 mg via INTRAVENOUS

## 2022-09-16 MED ORDER — SUGAMMADEX SODIUM 200 MG/2ML IV SOLN
INTRAVENOUS | Status: DC | PRN
  Administered 2022-09-16: 200 mg via INTRAVENOUS

## 2022-09-16 MED ORDER — OXYCODONE HCL 5 MG PO TABS
5.0000 mg | ORAL_TABLET | ORAL | Status: AC
  Administered 2022-09-16: 5 mg via ORAL

## 2022-09-16 MED ORDER — LACTATED RINGERS IV SOLN: INTRAVENOUS | Status: DC

## 2022-09-16 MED ORDER — SULFAMETHOXAZOLE-TRIMETHOPRIM 800-160 MG PO TABS: 1.0000 | ORAL_TABLET | Freq: Two times a day (BID) | ORAL | 0 refills | Status: DC

## 2022-09-16 MED ORDER — ACETAMINOPHEN 500 MG PO TABS
500.0000 mg | ORAL_TABLET | Freq: Once | ORAL | Status: AC
  Administered 2022-09-16: 500 mg via ORAL

## 2022-09-16 MED ORDER — PROPOFOL 10 MG/ML IV BOLUS
INTRAVENOUS | Status: DC | PRN
  Administered 2022-09-16: 180 mg via INTRAVENOUS

## 2022-09-16 MED ORDER — FENTANYL CITRATE (PF) 100 MCG/2ML IJ SOLN
INTRAMUSCULAR | Status: DC | PRN
  Administered 2022-09-16: 100 ug via INTRAVENOUS

## 2022-09-16 MED ORDER — PHENYLEPHRINE HCL (PRESSORS) 10 MG/ML IV SOLN
INTRAVENOUS | Status: DC | PRN
  Administered 2022-09-16: 100 ug via INTRAVENOUS

## 2022-09-16 MED ORDER — ROCURONIUM BROMIDE 100 MG/10ML IV SOLN
INTRAVENOUS | Status: DC | PRN
  Administered 2022-09-16: 50 mg via INTRAVENOUS

## 2022-09-16 MED ORDER — LIDOCAINE-EPINEPHRINE 1 %-1:100000 IJ SOLN
INTRAMUSCULAR | Status: DC | PRN
  Administered 2022-09-16: 8 mL

## 2022-09-16 MED ORDER — DEXMEDETOMIDINE HCL IN NACL 80 MCG/20ML IV SOLN
INTRAVENOUS | Status: DC | PRN
  Administered 2022-09-16: 12 ug via BUCCAL
  Administered 2022-09-16: 4 ug via BUCCAL
  Administered 2022-09-16: 8 ug via BUCCAL

## 2022-09-16 SURGICAL SUPPLY — 28 items
BLADE ELECT COATED/INSUL 125 (ELECTRODE) ×2 IMPLANT
CANISTER SUCT 1200ML W/VALVE (MISCELLANEOUS) ×2 IMPLANT
COAG SUCTION FOOTSWITCH 10FR (SUCTIONS) ×4 IMPLANT
COAGULATOR SUCT 8FR VV (MISCELLANEOUS) IMPLANT
DRESSING NASL FOAM PST OP SINU (MISCELLANEOUS) IMPLANT
DRSG NASAL FOAM POST OP SINU (MISCELLANEOUS) ×2
ELECT REM PT RETURN 9FT ADLT (ELECTROSURGICAL) ×2
ELECTRODE REM PT RTRN 9FT ADLT (ELECTROSURGICAL) ×2 IMPLANT
GLOVE SURG GAMMEX PI TX LF 7.5 (GLOVE) ×4 IMPLANT
GOWN STRL REUS W/ TWL LRG LVL3 (GOWN DISPOSABLE) ×2 IMPLANT
GOWN STRL REUS W/TWL LRG LVL3 (GOWN DISPOSABLE) ×2
KIT TURNOVER KIT A (KITS) ×2 IMPLANT
NDL HYPO 25GX1X1/2 BEV (NEEDLE) ×2 IMPLANT
NEEDLE HYPO 25GX1X1/2 BEV (NEEDLE) ×2 IMPLANT
NS IRRIG 500ML POUR BTL (IV SOLUTION) ×2 IMPLANT
PACK ENT CUSTOM (PACKS) ×2 IMPLANT
PATTIES SURGICAL .5 X3 (DISPOSABLE) ×2 IMPLANT
SOL ANTI-FOG 6CC FOG-OUT (MISCELLANEOUS) ×2 IMPLANT
SPLINT NASAL .50MM LRG (MISCELLANEOUS) IMPLANT
SPLINT NASAL SEPTAL BLV .25 LG (MISCELLANEOUS) ×2 IMPLANT
SPLINT NASAL SEPTAL BLV .50 ST (MISCELLANEOUS) ×2 IMPLANT
STRAP BODY AND KNEE 60X3 (MISCELLANEOUS) ×2 IMPLANT
SUT CHROMIC 4 0 RB 1X27 (SUTURE) ×2 IMPLANT
SUT ETHILON 3-0 FS-10 30 BLK (SUTURE) ×2
SUTURE EHLN 3-0 FS-10 30 BLK (SUTURE) ×2 IMPLANT
SYR 10ML LL (SYRINGE) ×2 IMPLANT
TOWEL OR 17X26 4PK STRL BLUE (TOWEL DISPOSABLE) ×2 IMPLANT
WATER STERILE IRR 250ML POUR (IV SOLUTION) IMPLANT

## 2022-09-16 NOTE — Anesthesia Procedure Notes (Signed)
Procedure Name: Intubation Date/Time: 09/16/2022 8:31 AM  Performed by: Patience Musca., CRNAPre-anesthesia Checklist: Patient identified, Patient being monitored, Timeout performed, Emergency Drugs available and Suction available Patient Re-evaluated:Patient Re-evaluated prior to induction Oxygen Delivery Method: Circle system utilized Preoxygenation: Pre-oxygenation with 100% oxygen Induction Type: IV induction Ventilation: Mask ventilation without difficulty Laryngoscope Size: Mac and 4 Grade View: Grade I Tube type: Oral Rae Tube size: 7.5 mm Number of attempts: 1 Airway Equipment and Method: Stylet Placement Confirmation: ETT inserted through vocal cords under direct vision, positive ETCO2 and breath sounds checked- equal and bilateral Tube secured with: Tape Dental Injury: Teeth and Oropharynx as per pre-operative assessment

## 2022-09-16 NOTE — H&P (Signed)
..  History and Physical paper copy reviewed and updated date of procedure and will be scanned into system.  Patient seen and examined.  

## 2022-09-16 NOTE — Op Note (Signed)
..09/16/2022  9:44 AM    Brett Hickman  [id number]    Pre-Op Dx:  Deviated Nasal Septum, Hypertrophic Inferior Turbinates  Post-op Dx: Same  Proc: Nasal Septoplasty, Bilateral Partial Reduction Inferior Turbinates   Surg:  Angeli Demilio  Anes:  GOT  EBL:  41m  Comp:  none  Findings: severe left sided cartilagenous and bone deviation, bilateral soft tissue and bone inferior turbinate hypertrophy.  Procedure: With the patient in a comfortable supine position,  general orotracheal anesthesia was induced without difficulty.  The patient received preoperative Afrin spray for topical decongestion and vasoconstriction.  At an appropriate level, the patient was placed in a semi-sitting position.  Nasal vibrissae were trimmed.   1% Xylocaine with 1:100,000 epinephrine, 8 cc's, was infiltrated into the anterior floor of the nose, into the nasal spine region, into the membranous columella, and finally into the submucoperichondrial plane of the septum on both sides.  Several minutes were allowed for this to take effect.  Cottoniod pledgetts soaked in Afrin were placed into both nasal cavities and left while the patient was prepped and draped in the standard fashion.   A proper time-out was performed.    The materials were removed from the nose and observed to be intact and correct in number.  The nose was inspected with a headlight and zero degree endoscope with the findings as described above.  A left Killian incision was sharply executed and carried down to the caudal edge of the quadrangular cartilage with a 15 blade scapel.  A mucoperichondrial flap was elelvated along the quadrangular plate back to the bony-cartilaginous junction using caudal elevator and freer elevator. The mucoperiostium was then elevated along the ethmoid plate and the vomer. An itracartilagenous incision was made using the freer elevator and a contralateral mucoperichondiral flap was elevated using a freer  elevator.  Care was taken to avoid any large rents or opposing rents in the mucoperichondrial flap.  Boney spurs of the vomer and maxillary crest were removed with Takahashi forceps.  The area of cartilagenous deviation was removed with combination of freer elevator and Takahashi forceps creating a widely patent nasal cavity as well as resolution of obstruction from the cartilagenous deviation. The mucosal flaps were placed back into their anatomic position to allow visualization of the airways. The septum now sat in the midline with an improved airway.  A 4-0 Chromic was used to close the KRiggstonincision as well.   The inferior turbinates were then inspected.  Under endoscopic visualization, the inferior turbinates were infractured bilaterally with a FSoil scientist  A kelly clamp was attached to the anterior-inferior third of each inferior turbinate for approximately one minute.  Under endoscopic visualization, Tru-cutting forceps were used to remove the anterior-inferior third of each inferior turbinate.  Electrocautery was used to control bleeding in the area. The remaining turbinate was then outfractured to open up the airway further. There was no significant bleeding noted. The right turbinate was then trimmed and outfractured in a similar fashion.  The airways were then visualized and showed open passageways on both sides that were significantly improved compared to before surgery.       There was no signifcant bleeding. Nasal splints were applied to both sides of the septum using Xomed 0.569mregular sized splints that were trimmed, and then held in position with a 3-0 Nylon through and through suture.  Stamberger sinufoam was placed along the cut edge of the inferior turbinates bilaterally.  The patient was turned back over  to anesthesia, and awakened, extubated, and taken to the PACU in satisfactory condition.  Dispo:   PACU to home  Plan: Ice, elevation, narcotic analgesia, steroid taper,  and prophylactic antibiotics for the duration of indwelling nasal foreign bodies.  We will reevaluate the patient in the office in 6 days and remove the septal splints.  Return to work in 10 days, strenuous activities in two weeks.   Brett Hickman 09/16/2022 9:44 AM

## 2022-09-16 NOTE — Transfer of Care (Signed)
Immediate Anesthesia Transfer of Care Note  Patient: Brett Hickman  Procedure(s) Performed: SEPTOPLASTY (Nose) TURBINATE REDUCTION (Bilateral)  Patient Location: PACU  Anesthesia Type: General ETT  Level of Consciousness: awake, alert  and patient cooperative  Airway and Oxygen Therapy: Patient Spontanous Breathing and Patient connected to supplemental oxygen  Post-op Assessment: Post-op Vital signs reviewed, Patient's Cardiovascular Status Stable, Respiratory Function Stable, Patent Airway and No signs of Nausea or vomiting  Post-op Vital Signs: Reviewed and stable  Complications: No notable events documented.

## 2022-09-16 NOTE — Anesthesia Postprocedure Evaluation (Signed)
Anesthesia Post Note  Patient: Brett Hickman  Procedure(s) Performed: SEPTOPLASTY (Nose) TURBINATE REDUCTION (Bilateral)  Patient location during evaluation: PACU Anesthesia Type: General Level of consciousness: awake and alert Pain management: pain level controlled Vital Signs Assessment: post-procedure vital signs reviewed and stable Respiratory status: spontaneous breathing, nonlabored ventilation, respiratory function stable and patient connected to nasal cannula oxygen Cardiovascular status: blood pressure returned to baseline and stable Postop Assessment: no apparent nausea or vomiting Anesthetic complications: no   There were no known notable events for this encounter.   Last Vitals:  Vitals:   09/16/22 1035 09/16/22 1045  BP:    Pulse: 69 74  Resp: (!) 21 14  Temp:    SpO2: 98% 98%    Last Pain:  Vitals:   09/16/22 1045  PainSc: [name]

## 2022-09-16 NOTE — Anesthesia Preprocedure Evaluation (Signed)
Anesthesia Evaluation  Patient identified by MRN, date of birth, ID band Patient awake    Reviewed: Allergy & Precautions, H&P , NPO status , Patient's Chart, lab work & pertinent test results, reviewed documented beta blocker date and time   Airway Mallampati: II  TM Distance: >3 FB Neck ROM: full    Dental  (+) Teeth Intact   Pulmonary neg pulmonary ROS   Pulmonary exam normal        Cardiovascular negative cardio ROS Normal cardiovascular exam Rhythm:regular Rate:Normal     Neuro/Psych negative neurological ROS  negative psych ROS   GI/Hepatic negative GI ROS, Neg liver ROS,,,  Endo/Other  negative endocrine ROS    Renal/GU negative Renal ROS  negative genitourinary   Musculoskeletal   Abdominal   Peds  Hematology negative hematology ROS (+)   Anesthesia Other Findings Past Medical History: 01/07/2022: Closed fracture of multiple ribs with routine healing 09/2020: COVID-19 03/16/2022: Dysplastic nevus     Comment:  low back spinal, moderate atypia 03/16/2022: Dysplastic nevus     Comment:  left lower back 6.0 cm lat to spine, moderate atypia Past Surgical History: No date: NM RENAL LASIX ([place] HX); Bilateral 12/29/2021: rib fixation; Right BMI    Body Mass Index: 22.00 kg/m     Reproductive/Obstetrics negative OB ROS                             Anesthesia Physical Anesthesia Plan  ASA: 2  Anesthesia Plan: General ETT   Post-op Pain Management:    Induction:   PONV Risk Score and Plan: 3  Airway Management Planned:   Additional Equipment:   Intra-op Plan:   Post-operative Plan:   Informed Consent: I have reviewed the patients History and Physical, chart, labs and discussed the procedure including the risks, benefits and alternatives for the proposed anesthesia with the patient or authorized representative who has indicated his/her understanding and acceptance.      Dental Advisory Given  Plan Discussed with: CRNA  Anesthesia Plan Comments:        Anesthesia Quick Evaluation

## 2022-09-17 ENCOUNTER — Encounter: Payer: Self-pay | Admitting: Otolaryngology

## 2023-01-20 ENCOUNTER — Encounter: Payer: BC Managed Care – PPO | Admitting: Dermatology

## 2023-03-23 NOTE — Progress Notes (Unsigned)
    Brett Hickman T. [name], MD, CAQ Sports Medicine [facility] [address] [place], [address]  Phone: [phone]  FAX: [phone]  Brett Hickman - 37 y.o. male  MRN [id number]  Date of Birth: 04-07-1986  Date: 03/24/2023  PCP: Brett Mayotte, MD  Referral: No ref. provider found  No chief complaint on file.  Subjective:   Brett Hickman is a 37 y.o. very pleasant male patient with There is no height or weight on file to calculate BMI. who presents with the following:  He is a pleasant 37 year old gentleman, former patient of Dr. Selena Hickman, and he presents for evaluation of some ongoing R elbow pain.    Review of Systems is noted in the HPI, as appropriate  Objective:   There were no vitals taken for this visit.  GEN: No acute distress; alert,appropriate. PULM: Breathing comfortably in no respiratory distress PSYCH: Normally interactive.   Laboratory and Imaging Data:  Assessment and Plan:   ***

## 2023-03-24 ENCOUNTER — Encounter: Payer: Self-pay | Admitting: Family Medicine

## 2023-03-24 ENCOUNTER — Ambulatory Visit (INDEPENDENT_AMBULATORY_CARE_PROVIDER_SITE_OTHER): Payer: BC Managed Care – PPO | Admitting: Family Medicine

## 2023-03-24 VITALS — BP 100/60 | HR 90 | Temp 98.1°F | Ht 75.0 in | Wt 179.1 lb

## 2023-03-24 DIAGNOSIS — M25521 Pain in right elbow: Secondary | ICD-10-CM | POA: Diagnosis not present

## 2023-03-24 DIAGNOSIS — S46911A Strain of unspecified muscle, fascia and tendon at shoulder and upper arm level, right arm, initial encounter: Secondary | ICD-10-CM | POA: Diagnosis not present

## 2023-03-24 NOTE — Patient Instructions (Signed)
You should be able to take generic ibuprofen orally. (Over the counter Motrin, Advil, or Generic Ibuprofen 200 mg tablets. 3-4 tablets by mouth 3 times a day. This equals a prescription strength dose.)   Voltaren 1% gel, over the counter You can apply up to 4 times a day  This can be applied to any joint: knee, wrist, fingers, elbows, shoulders, feet and ankles. Can apply to any tendon: tennis elbow, achilles, tendon, rotator cuff or any other tendon.  Minimal is absorbed in the bloodstream: ok with oral anti-inflammatory or a blood thinner.  Cost is about 9 dollars  

## 2023-04-19 DIAGNOSIS — H5213 Myopia, bilateral: Secondary | ICD-10-CM | POA: Diagnosis not present

## 2023-04-19 DIAGNOSIS — H40013 Open angle with borderline findings, low risk, bilateral: Secondary | ICD-10-CM | POA: Diagnosis not present

## 2023-04-29 DIAGNOSIS — S2239XA Fracture of one rib, unspecified side, initial encounter for closed fracture: Secondary | ICD-10-CM | POA: Diagnosis not present

## 2023-04-29 DIAGNOSIS — R0781 Pleurodynia: Secondary | ICD-10-CM | POA: Diagnosis not present

## 2023-05-10 DIAGNOSIS — S2239XA Fracture of one rib, unspecified side, initial encounter for closed fracture: Secondary | ICD-10-CM | POA: Diagnosis not present

## 2023-05-10 DIAGNOSIS — R0781 Pleurodynia: Secondary | ICD-10-CM | POA: Diagnosis not present

## 2023-05-12 DIAGNOSIS — R0781 Pleurodynia: Secondary | ICD-10-CM | POA: Diagnosis not present

## 2023-05-12 DIAGNOSIS — Z0000 Encounter for general adult medical examination without abnormal findings: Secondary | ICD-10-CM | POA: Diagnosis not present

## 2023-05-12 DIAGNOSIS — E781 Pure hyperglyceridemia: Secondary | ICD-10-CM | POA: Diagnosis not present

## 2023-05-14 DIAGNOSIS — M6281 Muscle weakness (generalized): Secondary | ICD-10-CM | POA: Diagnosis not present

## 2023-05-14 DIAGNOSIS — M546 Pain in thoracic spine: Secondary | ICD-10-CM | POA: Diagnosis not present

## 2023-05-14 DIAGNOSIS — M25521 Pain in right elbow: Secondary | ICD-10-CM | POA: Diagnosis not present

## 2023-05-14 DIAGNOSIS — M25511 Pain in right shoulder: Secondary | ICD-10-CM | POA: Diagnosis not present

## 2023-05-18 DIAGNOSIS — M25511 Pain in right shoulder: Secondary | ICD-10-CM | POA: Diagnosis not present

## 2023-05-18 DIAGNOSIS — M25521 Pain in right elbow: Secondary | ICD-10-CM | POA: Diagnosis not present

## 2023-05-18 DIAGNOSIS — M546 Pain in thoracic spine: Secondary | ICD-10-CM | POA: Diagnosis not present

## 2023-05-18 DIAGNOSIS — M6281 Muscle weakness (generalized): Secondary | ICD-10-CM | POA: Diagnosis not present

## 2023-05-20 DIAGNOSIS — M546 Pain in thoracic spine: Secondary | ICD-10-CM | POA: Diagnosis not present

## 2023-05-20 DIAGNOSIS — M25521 Pain in right elbow: Secondary | ICD-10-CM | POA: Diagnosis not present

## 2023-05-20 DIAGNOSIS — M6281 Muscle weakness (generalized): Secondary | ICD-10-CM | POA: Diagnosis not present

## 2023-05-20 DIAGNOSIS — M25511 Pain in right shoulder: Secondary | ICD-10-CM | POA: Diagnosis not present

## 2023-05-25 DIAGNOSIS — M25521 Pain in right elbow: Secondary | ICD-10-CM | POA: Diagnosis not present

## 2023-05-25 DIAGNOSIS — M546 Pain in thoracic spine: Secondary | ICD-10-CM | POA: Diagnosis not present

## 2023-05-25 DIAGNOSIS — M25511 Pain in right shoulder: Secondary | ICD-10-CM | POA: Diagnosis not present

## 2023-05-25 DIAGNOSIS — M6281 Muscle weakness (generalized): Secondary | ICD-10-CM | POA: Diagnosis not present

## 2023-05-27 DIAGNOSIS — M546 Pain in thoracic spine: Secondary | ICD-10-CM | POA: Diagnosis not present

## 2023-05-27 DIAGNOSIS — M6281 Muscle weakness (generalized): Secondary | ICD-10-CM | POA: Diagnosis not present

## 2023-05-27 DIAGNOSIS — M25521 Pain in right elbow: Secondary | ICD-10-CM | POA: Diagnosis not present

## 2023-05-27 DIAGNOSIS — M25511 Pain in right shoulder: Secondary | ICD-10-CM | POA: Diagnosis not present

## 2023-06-01 DIAGNOSIS — M546 Pain in thoracic spine: Secondary | ICD-10-CM | POA: Diagnosis not present

## 2023-06-01 DIAGNOSIS — M25511 Pain in right shoulder: Secondary | ICD-10-CM | POA: Diagnosis not present

## 2023-06-01 DIAGNOSIS — M25521 Pain in right elbow: Secondary | ICD-10-CM | POA: Diagnosis not present

## 2023-06-01 DIAGNOSIS — M6281 Muscle weakness (generalized): Secondary | ICD-10-CM | POA: Diagnosis not present

## 2023-06-09 DIAGNOSIS — M6281 Muscle weakness (generalized): Secondary | ICD-10-CM | POA: Diagnosis not present

## 2023-06-09 DIAGNOSIS — M25511 Pain in right shoulder: Secondary | ICD-10-CM | POA: Diagnosis not present

## 2023-06-09 DIAGNOSIS — M546 Pain in thoracic spine: Secondary | ICD-10-CM | POA: Diagnosis not present

## 2023-06-09 DIAGNOSIS — M25521 Pain in right elbow: Secondary | ICD-10-CM | POA: Diagnosis not present

## 2023-06-11 DIAGNOSIS — M25511 Pain in right shoulder: Secondary | ICD-10-CM | POA: Diagnosis not present

## 2023-06-11 DIAGNOSIS — M6281 Muscle weakness (generalized): Secondary | ICD-10-CM | POA: Diagnosis not present

## 2023-06-11 DIAGNOSIS — M546 Pain in thoracic spine: Secondary | ICD-10-CM | POA: Diagnosis not present

## 2023-06-11 DIAGNOSIS — M25521 Pain in right elbow: Secondary | ICD-10-CM | POA: Diagnosis not present

## 2023-06-19 DIAGNOSIS — M6281 Muscle weakness (generalized): Secondary | ICD-10-CM | POA: Diagnosis not present

## 2023-06-19 DIAGNOSIS — M25511 Pain in right shoulder: Secondary | ICD-10-CM | POA: Diagnosis not present

## 2023-06-19 DIAGNOSIS — M546 Pain in thoracic spine: Secondary | ICD-10-CM | POA: Diagnosis not present

## 2023-06-19 DIAGNOSIS — M25521 Pain in right elbow: Secondary | ICD-10-CM | POA: Diagnosis not present

## 2023-06-22 DIAGNOSIS — R051 Acute cough: Secondary | ICD-10-CM | POA: Diagnosis not present

## 2023-06-22 DIAGNOSIS — R0989 Other specified symptoms and signs involving the circulatory and respiratory systems: Secondary | ICD-10-CM | POA: Diagnosis not present

## 2023-06-22 DIAGNOSIS — J189 Pneumonia, unspecified organism: Secondary | ICD-10-CM | POA: Diagnosis not present

## 2023-06-28 DIAGNOSIS — M546 Pain in thoracic spine: Secondary | ICD-10-CM | POA: Diagnosis not present

## 2023-06-28 DIAGNOSIS — M25511 Pain in right shoulder: Secondary | ICD-10-CM | POA: Diagnosis not present

## 2023-06-28 DIAGNOSIS — M6281 Muscle weakness (generalized): Secondary | ICD-10-CM | POA: Diagnosis not present

## 2023-06-28 DIAGNOSIS — M25521 Pain in right elbow: Secondary | ICD-10-CM | POA: Diagnosis not present

## 2023-07-01 DIAGNOSIS — M25511 Pain in right shoulder: Secondary | ICD-10-CM | POA: Diagnosis not present

## 2023-07-01 DIAGNOSIS — M546 Pain in thoracic spine: Secondary | ICD-10-CM | POA: Diagnosis not present

## 2023-07-01 DIAGNOSIS — M25521 Pain in right elbow: Secondary | ICD-10-CM | POA: Diagnosis not present

## 2023-07-01 DIAGNOSIS — M6281 Muscle weakness (generalized): Secondary | ICD-10-CM | POA: Diagnosis not present

## 2023-07-05 DIAGNOSIS — M25511 Pain in right shoulder: Secondary | ICD-10-CM | POA: Diagnosis not present

## 2023-07-05 DIAGNOSIS — M6281 Muscle weakness (generalized): Secondary | ICD-10-CM | POA: Diagnosis not present

## 2023-07-05 DIAGNOSIS — M546 Pain in thoracic spine: Secondary | ICD-10-CM | POA: Diagnosis not present

## 2023-07-05 DIAGNOSIS — M25521 Pain in right elbow: Secondary | ICD-10-CM | POA: Diagnosis not present

## 2023-07-07 DIAGNOSIS — M25521 Pain in right elbow: Secondary | ICD-10-CM | POA: Diagnosis not present

## 2023-07-07 DIAGNOSIS — M25511 Pain in right shoulder: Secondary | ICD-10-CM | POA: Diagnosis not present

## 2023-07-07 DIAGNOSIS — M546 Pain in thoracic spine: Secondary | ICD-10-CM | POA: Diagnosis not present

## 2023-07-07 DIAGNOSIS — M6281 Muscle weakness (generalized): Secondary | ICD-10-CM | POA: Diagnosis not present

## 2023-07-12 DIAGNOSIS — M25511 Pain in right shoulder: Secondary | ICD-10-CM | POA: Diagnosis not present

## 2023-07-12 DIAGNOSIS — M546 Pain in thoracic spine: Secondary | ICD-10-CM | POA: Diagnosis not present

## 2023-07-12 DIAGNOSIS — M6281 Muscle weakness (generalized): Secondary | ICD-10-CM | POA: Diagnosis not present

## 2023-07-12 DIAGNOSIS — M25521 Pain in right elbow: Secondary | ICD-10-CM | POA: Diagnosis not present

## 2023-07-17 DIAGNOSIS — M6281 Muscle weakness (generalized): Secondary | ICD-10-CM | POA: Diagnosis not present

## 2023-07-17 DIAGNOSIS — M25511 Pain in right shoulder: Secondary | ICD-10-CM | POA: Diagnosis not present

## 2023-07-17 DIAGNOSIS — M546 Pain in thoracic spine: Secondary | ICD-10-CM | POA: Diagnosis not present

## 2023-07-17 DIAGNOSIS — M25521 Pain in right elbow: Secondary | ICD-10-CM | POA: Diagnosis not present

## 2023-07-23 DIAGNOSIS — M25521 Pain in right elbow: Secondary | ICD-10-CM | POA: Diagnosis not present

## 2023-07-23 DIAGNOSIS — M25511 Pain in right shoulder: Secondary | ICD-10-CM | POA: Diagnosis not present

## 2023-07-23 DIAGNOSIS — M6281 Muscle weakness (generalized): Secondary | ICD-10-CM | POA: Diagnosis not present

## 2023-07-23 DIAGNOSIS — M546 Pain in thoracic spine: Secondary | ICD-10-CM | POA: Diagnosis not present

## 2023-07-29 DIAGNOSIS — M25511 Pain in right shoulder: Secondary | ICD-10-CM | POA: Diagnosis not present

## 2023-07-29 DIAGNOSIS — M25521 Pain in right elbow: Secondary | ICD-10-CM | POA: Diagnosis not present

## 2023-07-29 DIAGNOSIS — M6281 Muscle weakness (generalized): Secondary | ICD-10-CM | POA: Diagnosis not present

## 2023-07-29 DIAGNOSIS — M546 Pain in thoracic spine: Secondary | ICD-10-CM | POA: Diagnosis not present

## 2023-07-30 DIAGNOSIS — M25511 Pain in right shoulder: Secondary | ICD-10-CM | POA: Diagnosis not present

## 2023-07-30 DIAGNOSIS — M25521 Pain in right elbow: Secondary | ICD-10-CM | POA: Diagnosis not present

## 2023-07-30 DIAGNOSIS — M546 Pain in thoracic spine: Secondary | ICD-10-CM | POA: Diagnosis not present

## 2023-07-30 DIAGNOSIS — M6281 Muscle weakness (generalized): Secondary | ICD-10-CM | POA: Diagnosis not present

## 2023-08-04 DIAGNOSIS — M25511 Pain in right shoulder: Secondary | ICD-10-CM | POA: Diagnosis not present

## 2023-08-04 DIAGNOSIS — M546 Pain in thoracic spine: Secondary | ICD-10-CM | POA: Diagnosis not present

## 2023-08-04 DIAGNOSIS — M25521 Pain in right elbow: Secondary | ICD-10-CM | POA: Diagnosis not present

## 2023-08-04 DIAGNOSIS — M6281 Muscle weakness (generalized): Secondary | ICD-10-CM | POA: Diagnosis not present

## 2023-08-06 DIAGNOSIS — M25521 Pain in right elbow: Secondary | ICD-10-CM | POA: Diagnosis not present

## 2023-08-06 DIAGNOSIS — M546 Pain in thoracic spine: Secondary | ICD-10-CM | POA: Diagnosis not present

## 2023-08-06 DIAGNOSIS — M6281 Muscle weakness (generalized): Secondary | ICD-10-CM | POA: Diagnosis not present

## 2023-08-06 DIAGNOSIS — M25511 Pain in right shoulder: Secondary | ICD-10-CM | POA: Diagnosis not present

## 2023-08-10 DIAGNOSIS — M25511 Pain in right shoulder: Secondary | ICD-10-CM | POA: Diagnosis not present

## 2023-08-10 DIAGNOSIS — M6281 Muscle weakness (generalized): Secondary | ICD-10-CM | POA: Diagnosis not present

## 2023-08-10 DIAGNOSIS — M25521 Pain in right elbow: Secondary | ICD-10-CM | POA: Diagnosis not present

## 2023-08-10 DIAGNOSIS — M546 Pain in thoracic spine: Secondary | ICD-10-CM | POA: Diagnosis not present

## 2023-08-14 DIAGNOSIS — M546 Pain in thoracic spine: Secondary | ICD-10-CM | POA: Diagnosis not present

## 2023-08-14 DIAGNOSIS — M6281 Muscle weakness (generalized): Secondary | ICD-10-CM | POA: Diagnosis not present

## 2023-08-14 DIAGNOSIS — M25521 Pain in right elbow: Secondary | ICD-10-CM | POA: Diagnosis not present

## 2023-08-14 DIAGNOSIS — M25511 Pain in right shoulder: Secondary | ICD-10-CM | POA: Diagnosis not present

## 2023-08-20 DIAGNOSIS — M25521 Pain in right elbow: Secondary | ICD-10-CM | POA: Diagnosis not present

## 2023-08-20 DIAGNOSIS — M25511 Pain in right shoulder: Secondary | ICD-10-CM | POA: Diagnosis not present

## 2023-08-20 DIAGNOSIS — M546 Pain in thoracic spine: Secondary | ICD-10-CM | POA: Diagnosis not present

## 2023-08-20 DIAGNOSIS — M6281 Muscle weakness (generalized): Secondary | ICD-10-CM | POA: Diagnosis not present

## 2023-08-25 DIAGNOSIS — M6281 Muscle weakness (generalized): Secondary | ICD-10-CM | POA: Diagnosis not present

## 2023-08-25 DIAGNOSIS — M25511 Pain in right shoulder: Secondary | ICD-10-CM | POA: Diagnosis not present

## 2023-08-25 DIAGNOSIS — M25521 Pain in right elbow: Secondary | ICD-10-CM | POA: Diagnosis not present

## 2023-08-25 DIAGNOSIS — M546 Pain in thoracic spine: Secondary | ICD-10-CM | POA: Diagnosis not present

## 2023-09-07 DIAGNOSIS — M6281 Muscle weakness (generalized): Secondary | ICD-10-CM | POA: Diagnosis not present

## 2023-09-07 DIAGNOSIS — M546 Pain in thoracic spine: Secondary | ICD-10-CM | POA: Diagnosis not present

## 2023-09-07 DIAGNOSIS — M25521 Pain in right elbow: Secondary | ICD-10-CM | POA: Diagnosis not present

## 2023-09-07 DIAGNOSIS — M25511 Pain in right shoulder: Secondary | ICD-10-CM | POA: Diagnosis not present

## 2023-09-09 DIAGNOSIS — M6281 Muscle weakness (generalized): Secondary | ICD-10-CM | POA: Diagnosis not present

## 2023-09-09 DIAGNOSIS — M546 Pain in thoracic spine: Secondary | ICD-10-CM | POA: Diagnosis not present

## 2023-09-09 DIAGNOSIS — M25521 Pain in right elbow: Secondary | ICD-10-CM | POA: Diagnosis not present

## 2023-09-09 DIAGNOSIS — M25511 Pain in right shoulder: Secondary | ICD-10-CM | POA: Diagnosis not present

## 2023-09-13 DIAGNOSIS — M6281 Muscle weakness (generalized): Secondary | ICD-10-CM | POA: Diagnosis not present

## 2023-09-13 DIAGNOSIS — M546 Pain in thoracic spine: Secondary | ICD-10-CM | POA: Diagnosis not present

## 2023-09-13 DIAGNOSIS — M25511 Pain in right shoulder: Secondary | ICD-10-CM | POA: Diagnosis not present

## 2023-09-13 DIAGNOSIS — M25521 Pain in right elbow: Secondary | ICD-10-CM | POA: Diagnosis not present

## 2023-09-15 DIAGNOSIS — M25511 Pain in right shoulder: Secondary | ICD-10-CM | POA: Diagnosis not present

## 2023-09-15 DIAGNOSIS — M25521 Pain in right elbow: Secondary | ICD-10-CM | POA: Diagnosis not present

## 2023-09-15 DIAGNOSIS — M546 Pain in thoracic spine: Secondary | ICD-10-CM | POA: Diagnosis not present

## 2023-09-15 DIAGNOSIS — M6281 Muscle weakness (generalized): Secondary | ICD-10-CM | POA: Diagnosis not present

## 2023-09-23 DIAGNOSIS — M6281 Muscle weakness (generalized): Secondary | ICD-10-CM | POA: Diagnosis not present

## 2023-09-23 DIAGNOSIS — M546 Pain in thoracic spine: Secondary | ICD-10-CM | POA: Diagnosis not present

## 2023-09-23 DIAGNOSIS — M25521 Pain in right elbow: Secondary | ICD-10-CM | POA: Diagnosis not present

## 2023-09-23 DIAGNOSIS — M25511 Pain in right shoulder: Secondary | ICD-10-CM | POA: Diagnosis not present

## 2023-10-12 DIAGNOSIS — M25521 Pain in right elbow: Secondary | ICD-10-CM | POA: Diagnosis not present

## 2023-10-12 DIAGNOSIS — M6281 Muscle weakness (generalized): Secondary | ICD-10-CM | POA: Diagnosis not present

## 2023-10-12 DIAGNOSIS — M25511 Pain in right shoulder: Secondary | ICD-10-CM | POA: Diagnosis not present

## 2023-10-12 DIAGNOSIS — M546 Pain in thoracic spine: Secondary | ICD-10-CM | POA: Diagnosis not present

## 2023-10-16 DIAGNOSIS — M25511 Pain in right shoulder: Secondary | ICD-10-CM | POA: Diagnosis not present

## 2023-10-16 DIAGNOSIS — M546 Pain in thoracic spine: Secondary | ICD-10-CM | POA: Diagnosis not present

## 2023-10-16 DIAGNOSIS — M6281 Muscle weakness (generalized): Secondary | ICD-10-CM | POA: Diagnosis not present

## 2023-10-16 DIAGNOSIS — M25521 Pain in right elbow: Secondary | ICD-10-CM | POA: Diagnosis not present

## 2023-10-19 DIAGNOSIS — M6281 Muscle weakness (generalized): Secondary | ICD-10-CM | POA: Diagnosis not present

## 2023-10-19 DIAGNOSIS — M546 Pain in thoracic spine: Secondary | ICD-10-CM | POA: Diagnosis not present

## 2023-10-19 DIAGNOSIS — M25511 Pain in right shoulder: Secondary | ICD-10-CM | POA: Diagnosis not present

## 2023-10-19 DIAGNOSIS — M25521 Pain in right elbow: Secondary | ICD-10-CM | POA: Diagnosis not present

## 2023-10-26 DIAGNOSIS — M25521 Pain in right elbow: Secondary | ICD-10-CM | POA: Diagnosis not present

## 2023-10-26 DIAGNOSIS — M25511 Pain in right shoulder: Secondary | ICD-10-CM | POA: Diagnosis not present

## 2023-10-26 DIAGNOSIS — M546 Pain in thoracic spine: Secondary | ICD-10-CM | POA: Diagnosis not present

## 2023-10-26 DIAGNOSIS — M6281 Muscle weakness (generalized): Secondary | ICD-10-CM | POA: Diagnosis not present

## 2023-10-28 DIAGNOSIS — H40013 Open angle with borderline findings, low risk, bilateral: Secondary | ICD-10-CM | POA: Diagnosis not present

## 2023-11-03 DIAGNOSIS — M25511 Pain in right shoulder: Secondary | ICD-10-CM | POA: Diagnosis not present

## 2023-11-03 DIAGNOSIS — R0781 Pleurodynia: Secondary | ICD-10-CM | POA: Diagnosis not present

## 2023-11-03 DIAGNOSIS — Z23 Encounter for immunization: Secondary | ICD-10-CM | POA: Diagnosis not present

## 2023-11-10 DIAGNOSIS — M6281 Muscle weakness (generalized): Secondary | ICD-10-CM | POA: Diagnosis not present

## 2023-11-10 DIAGNOSIS — M25511 Pain in right shoulder: Secondary | ICD-10-CM | POA: Diagnosis not present

## 2023-11-10 DIAGNOSIS — M25651 Stiffness of right hip, not elsewhere classified: Secondary | ICD-10-CM | POA: Diagnosis not present

## 2023-11-10 DIAGNOSIS — M25652 Stiffness of left hip, not elsewhere classified: Secondary | ICD-10-CM | POA: Diagnosis not present

## 2023-11-11 DIAGNOSIS — M25652 Stiffness of left hip, not elsewhere classified: Secondary | ICD-10-CM | POA: Diagnosis not present

## 2023-11-11 DIAGNOSIS — M6281 Muscle weakness (generalized): Secondary | ICD-10-CM | POA: Diagnosis not present

## 2023-11-11 DIAGNOSIS — M25511 Pain in right shoulder: Secondary | ICD-10-CM | POA: Diagnosis not present

## 2023-11-11 DIAGNOSIS — M25651 Stiffness of right hip, not elsewhere classified: Secondary | ICD-10-CM | POA: Diagnosis not present

## 2023-11-13 DIAGNOSIS — M25651 Stiffness of right hip, not elsewhere classified: Secondary | ICD-10-CM | POA: Diagnosis not present

## 2023-11-13 DIAGNOSIS — M25511 Pain in right shoulder: Secondary | ICD-10-CM | POA: Diagnosis not present

## 2023-11-13 DIAGNOSIS — M6281 Muscle weakness (generalized): Secondary | ICD-10-CM | POA: Diagnosis not present

## 2023-11-13 DIAGNOSIS — M25652 Stiffness of left hip, not elsewhere classified: Secondary | ICD-10-CM | POA: Diagnosis not present

## 2023-11-16 DIAGNOSIS — M25652 Stiffness of left hip, not elsewhere classified: Secondary | ICD-10-CM | POA: Diagnosis not present

## 2023-11-16 DIAGNOSIS — M6281 Muscle weakness (generalized): Secondary | ICD-10-CM | POA: Diagnosis not present

## 2023-11-16 DIAGNOSIS — M25651 Stiffness of right hip, not elsewhere classified: Secondary | ICD-10-CM | POA: Diagnosis not present

## 2023-11-16 DIAGNOSIS — M25511 Pain in right shoulder: Secondary | ICD-10-CM | POA: Diagnosis not present

## 2023-11-18 DIAGNOSIS — M25651 Stiffness of right hip, not elsewhere classified: Secondary | ICD-10-CM | POA: Diagnosis not present

## 2023-11-18 DIAGNOSIS — M25652 Stiffness of left hip, not elsewhere classified: Secondary | ICD-10-CM | POA: Diagnosis not present

## 2023-11-18 DIAGNOSIS — M6281 Muscle weakness (generalized): Secondary | ICD-10-CM | POA: Diagnosis not present

## 2023-11-18 DIAGNOSIS — M25511 Pain in right shoulder: Secondary | ICD-10-CM | POA: Diagnosis not present

## 2023-11-25 DIAGNOSIS — M6281 Muscle weakness (generalized): Secondary | ICD-10-CM | POA: Diagnosis not present

## 2023-11-25 DIAGNOSIS — M25511 Pain in right shoulder: Secondary | ICD-10-CM | POA: Diagnosis not present

## 2023-11-25 DIAGNOSIS — M25652 Stiffness of left hip, not elsewhere classified: Secondary | ICD-10-CM | POA: Diagnosis not present

## 2023-11-25 DIAGNOSIS — M25651 Stiffness of right hip, not elsewhere classified: Secondary | ICD-10-CM | POA: Diagnosis not present

## 2023-11-26 DIAGNOSIS — M6281 Muscle weakness (generalized): Secondary | ICD-10-CM | POA: Diagnosis not present

## 2023-11-26 DIAGNOSIS — M25651 Stiffness of right hip, not elsewhere classified: Secondary | ICD-10-CM | POA: Diagnosis not present

## 2023-11-26 DIAGNOSIS — M25511 Pain in right shoulder: Secondary | ICD-10-CM | POA: Diagnosis not present

## 2023-11-26 DIAGNOSIS — M25652 Stiffness of left hip, not elsewhere classified: Secondary | ICD-10-CM | POA: Diagnosis not present

## 2023-11-29 DIAGNOSIS — M6281 Muscle weakness (generalized): Secondary | ICD-10-CM | POA: Diagnosis not present

## 2023-11-29 DIAGNOSIS — M25652 Stiffness of left hip, not elsewhere classified: Secondary | ICD-10-CM | POA: Diagnosis not present

## 2023-11-29 DIAGNOSIS — M25651 Stiffness of right hip, not elsewhere classified: Secondary | ICD-10-CM | POA: Diagnosis not present

## 2023-11-29 DIAGNOSIS — M25511 Pain in right shoulder: Secondary | ICD-10-CM | POA: Diagnosis not present

## 2023-12-04 DIAGNOSIS — M25511 Pain in right shoulder: Secondary | ICD-10-CM | POA: Diagnosis not present

## 2023-12-04 DIAGNOSIS — M6281 Muscle weakness (generalized): Secondary | ICD-10-CM | POA: Diagnosis not present

## 2023-12-04 DIAGNOSIS — M25651 Stiffness of right hip, not elsewhere classified: Secondary | ICD-10-CM | POA: Diagnosis not present

## 2023-12-04 DIAGNOSIS — M25652 Stiffness of left hip, not elsewhere classified: Secondary | ICD-10-CM | POA: Diagnosis not present

## 2023-12-07 DIAGNOSIS — M25651 Stiffness of right hip, not elsewhere classified: Secondary | ICD-10-CM | POA: Diagnosis not present

## 2023-12-07 DIAGNOSIS — M25652 Stiffness of left hip, not elsewhere classified: Secondary | ICD-10-CM | POA: Diagnosis not present

## 2023-12-07 DIAGNOSIS — M25511 Pain in right shoulder: Secondary | ICD-10-CM | POA: Diagnosis not present

## 2023-12-07 DIAGNOSIS — M6281 Muscle weakness (generalized): Secondary | ICD-10-CM | POA: Diagnosis not present

## 2023-12-09 DIAGNOSIS — M25651 Stiffness of right hip, not elsewhere classified: Secondary | ICD-10-CM | POA: Diagnosis not present

## 2023-12-09 DIAGNOSIS — M6281 Muscle weakness (generalized): Secondary | ICD-10-CM | POA: Diagnosis not present

## 2023-12-09 DIAGNOSIS — M25652 Stiffness of left hip, not elsewhere classified: Secondary | ICD-10-CM | POA: Diagnosis not present

## 2023-12-09 DIAGNOSIS — M25511 Pain in right shoulder: Secondary | ICD-10-CM | POA: Diagnosis not present

## 2023-12-14 DIAGNOSIS — M25651 Stiffness of right hip, not elsewhere classified: Secondary | ICD-10-CM | POA: Diagnosis not present

## 2023-12-14 DIAGNOSIS — M25511 Pain in right shoulder: Secondary | ICD-10-CM | POA: Diagnosis not present

## 2023-12-14 DIAGNOSIS — M6281 Muscle weakness (generalized): Secondary | ICD-10-CM | POA: Diagnosis not present

## 2023-12-14 DIAGNOSIS — M25652 Stiffness of left hip, not elsewhere classified: Secondary | ICD-10-CM | POA: Diagnosis not present

## 2023-12-16 DIAGNOSIS — M25511 Pain in right shoulder: Secondary | ICD-10-CM | POA: Diagnosis not present

## 2023-12-16 DIAGNOSIS — M25651 Stiffness of right hip, not elsewhere classified: Secondary | ICD-10-CM | POA: Diagnosis not present

## 2023-12-16 DIAGNOSIS — M6281 Muscle weakness (generalized): Secondary | ICD-10-CM | POA: Diagnosis not present

## 2023-12-16 DIAGNOSIS — M25652 Stiffness of left hip, not elsewhere classified: Secondary | ICD-10-CM | POA: Diagnosis not present

## 2023-12-30 DIAGNOSIS — M25511 Pain in right shoulder: Secondary | ICD-10-CM | POA: Diagnosis not present

## 2023-12-30 DIAGNOSIS — M6281 Muscle weakness (generalized): Secondary | ICD-10-CM | POA: Diagnosis not present

## 2023-12-30 DIAGNOSIS — M25651 Stiffness of right hip, not elsewhere classified: Secondary | ICD-10-CM | POA: Diagnosis not present

## 2023-12-30 DIAGNOSIS — M25652 Stiffness of left hip, not elsewhere classified: Secondary | ICD-10-CM | POA: Diagnosis not present

## 2024-01-15 DIAGNOSIS — M25652 Stiffness of left hip, not elsewhere classified: Secondary | ICD-10-CM | POA: Diagnosis not present

## 2024-01-15 DIAGNOSIS — M25511 Pain in right shoulder: Secondary | ICD-10-CM | POA: Diagnosis not present

## 2024-01-15 DIAGNOSIS — M25651 Stiffness of right hip, not elsewhere classified: Secondary | ICD-10-CM | POA: Diagnosis not present

## 2024-01-15 DIAGNOSIS — M6281 Muscle weakness (generalized): Secondary | ICD-10-CM | POA: Diagnosis not present

## 2024-02-12 DIAGNOSIS — M25651 Stiffness of right hip, not elsewhere classified: Secondary | ICD-10-CM | POA: Diagnosis not present

## 2024-02-12 DIAGNOSIS — M6281 Muscle weakness (generalized): Secondary | ICD-10-CM | POA: Diagnosis not present

## 2024-02-12 DIAGNOSIS — M25652 Stiffness of left hip, not elsewhere classified: Secondary | ICD-10-CM | POA: Diagnosis not present

## 2024-02-12 DIAGNOSIS — M25511 Pain in right shoulder: Secondary | ICD-10-CM | POA: Diagnosis not present

## 2024-04-26 DIAGNOSIS — H40013 Open angle with borderline findings, low risk, bilateral: Secondary | ICD-10-CM | POA: Diagnosis not present

## 2024-04-26 DIAGNOSIS — H9311 Tinnitus, right ear: Secondary | ICD-10-CM | POA: Diagnosis not present

## 2024-04-26 DIAGNOSIS — H52223 Regular astigmatism, bilateral: Secondary | ICD-10-CM | POA: Diagnosis not present

## 2024-04-26 DIAGNOSIS — H5213 Myopia, bilateral: Secondary | ICD-10-CM | POA: Diagnosis not present

## 2024-06-08 DIAGNOSIS — Z139 Encounter for screening, unspecified: Secondary | ICD-10-CM | POA: Diagnosis not present

## 2024-06-08 DIAGNOSIS — Z0000 Encounter for general adult medical examination without abnormal findings: Secondary | ICD-10-CM | POA: Diagnosis not present

## 2024-06-08 DIAGNOSIS — Z23 Encounter for immunization: Secondary | ICD-10-CM | POA: Diagnosis not present

## 2024-06-08 DIAGNOSIS — E782 Mixed hyperlipidemia: Secondary | ICD-10-CM | POA: Diagnosis not present
